# Patient Record
Sex: Male | Born: 1980 | Race: White | Hispanic: No | Marital: Single
Health system: Southern US, Academic
[De-identification: ages and names within clinical notes are randomized; demographics above are authoritative.]

## PROBLEM LIST (undated history)

## (undated) ENCOUNTER — Encounter

## (undated) ENCOUNTER — Ambulatory Visit

## (undated) ENCOUNTER — Telehealth

## (undated) ENCOUNTER — Inpatient Hospital Stay

## (undated) ENCOUNTER — Encounter
Attending: Student in an Organized Health Care Education/Training Program | Primary: Student in an Organized Health Care Education/Training Program

## (undated) ENCOUNTER — Ambulatory Visit: Payer: Medicaid (Managed Care)

## (undated) ENCOUNTER — Encounter: Attending: Physician Assistant | Primary: Physician Assistant

## (undated) ENCOUNTER — Ambulatory Visit: Payer: PRIVATE HEALTH INSURANCE

## (undated) ENCOUNTER — Encounter: Attending: Gastroenterology | Primary: Gastroenterology

## (undated) ENCOUNTER — Encounter: Attending: Surgery | Primary: Surgery

## (undated) ENCOUNTER — Ambulatory Visit
Payer: PRIVATE HEALTH INSURANCE | Attending: Student in an Organized Health Care Education/Training Program | Primary: Student in an Organized Health Care Education/Training Program

## (undated) ENCOUNTER — Ambulatory Visit: Attending: Surgery | Primary: Surgery

## (undated) DIAGNOSIS — I10 Essential (primary) hypertension: Secondary | ICD-10-CM

## (undated) DIAGNOSIS — Z87442 Personal history of urinary calculi: Secondary | ICD-10-CM

## (undated) DIAGNOSIS — K859 Acute pancreatitis without necrosis or infection, unspecified: Secondary | ICD-10-CM

## (undated) HISTORY — DX: Essential (primary) hypertension: I10

## (undated) HISTORY — PX: CHOLECYSTECTOMY: SHX55

## (undated) HISTORY — PX: ESOPHAGOGASTRODUODENOSCOPY: SHX1529

## (undated) HISTORY — PX: LITHOTRIPSY: SUR834

## (undated) HISTORY — PX: NERVE SURGERY: SHX1016

## (undated) MED ORDER — PANTOPRAZOLE 40 MG TABLET,DELAYED RELEASE: Freq: Every day | ORAL | 0.00000 days

## (undated) MED ORDER — SUCRALFATE 1 GRAM TABLET: Freq: Four times a day (QID) | ORAL | 0 days

---
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
## 2005-02-10 ENCOUNTER — Emergency Department (HOSPITAL_COMMUNITY): Admission: AD | Admit: 2005-02-10 | Discharge: 2005-02-10 | Payer: Self-pay | Admitting: Emergency Medicine

## 2007-10-17 ENCOUNTER — Ambulatory Visit (HOSPITAL_COMMUNITY): Admission: RE | Admit: 2007-10-17 | Discharge: 2007-10-17 | Payer: Self-pay | Admitting: Urology

## 2008-03-08 ENCOUNTER — Emergency Department (HOSPITAL_COMMUNITY): Admission: EM | Admit: 2008-03-08 | Discharge: 2008-03-08 | Payer: Self-pay | Admitting: Family Medicine

## 2010-04-20 LAB — POCT RAPID STREP A (OFFICE): Streptococcus, Group A Screen (Direct): NEGATIVE

## 2012-08-29 ENCOUNTER — Ambulatory Visit (INDEPENDENT_AMBULATORY_CARE_PROVIDER_SITE_OTHER): Payer: BC Managed Care – PPO | Admitting: Family Medicine

## 2012-08-29 ENCOUNTER — Ambulatory Visit (INDEPENDENT_AMBULATORY_CARE_PROVIDER_SITE_OTHER): Payer: BC Managed Care – PPO

## 2012-08-29 ENCOUNTER — Encounter: Payer: Self-pay | Admitting: Family Medicine

## 2012-08-29 VITALS — BP 116/76 | HR 75 | Temp 98.0°F | Ht 73.0 in | Wt 195.0 lb

## 2012-08-29 DIAGNOSIS — S6980XA Other specified injuries of unspecified wrist, hand and finger(s), initial encounter: Secondary | ICD-10-CM

## 2012-08-29 DIAGNOSIS — S6991XA Unspecified injury of right wrist, hand and finger(s), initial encounter: Secondary | ICD-10-CM

## 2012-08-29 DIAGNOSIS — I10 Essential (primary) hypertension: Secondary | ICD-10-CM

## 2012-08-29 NOTE — Patient Instructions (Signed)
Fracture A fracture is a break in a bone, due to a force on the bone that is greater than the bone's strength can handle. There are many types of fractures, including:  Complete fracture: The break passes completely through the bone.  Displaced: The ends of the bone fragments are not properly aligned.  Non-displaced: The ends of the bone fragments are in proper alignment.  Incomplete fracture (greenstick): The break does not pass completely through the bone. Incomplete fractures may or may not be angular (angulated).  Open fracture (compound): Part of the broken bone pokes through the skin. Open fractures have a high risk for infection.  Closed fracture: The fracture has not broken through the skin.  Comminuted fracture: The bone is broken into more than two pieces.  Compression fracture: The break occurs from extreme pressure on the bone (includes crushing injury).  Impacted fracture: The broken bone ends have been driven into each other.  Avulsion fracture: A ligament or tendon pulls a small piece of bone off from the main bony segment.  Pathologic fracture: A fracture due to the bone being made weak by a disease (osteoporosis or tumors).  Stress fracture: A fracture caused by intense exercise or repetitive and prolonged pressure that makes the bone weak. SYMPTOMS   Pain, tenderness, bleeding, bruising, and swelling at the fracture site.  Weakness and inability to bear weight on the injured extremity.  Paleness and deformity (sometimes).  Loss of pulse, numbness, tingling, or paralysis below the fracture site (usually a limb); these are emergencies. CAUSES  Bone being subjected to a force greater than its strength. RISK INCREASES WITH:  Contact sports and falls from heights.  Previous or current bone problems (osteoporosis or tumors).  Poor balance.  Poor strength and flexibility. PREVENTION   Warm up and stretch properly before activity.  Maintain physical  fitness:  Cardiovascular fitness.  Muscle strength.  Flexibility and endurance.  Wear proper protective equipment.  Use proper exercise technique. RELATED COMPLICATIONS   Bone fails to heal (nonunion).  Bone heals in a poor position (malunion).  Low blood volume (hypovolemic), shock due to blood loss.  Clump of fat cells travels through the blood (fat embolus) from the injury site to the lungs or brain (more common with thigh fractures).  Obstruction of nearby arteries. TREATMENT  Treatment first requires realigning of the bones (reduction) by a medically trained person, if the fracture is displaced. After realignment if the fracture is completed, or for non-displaced fractures, ice and medicine are used to reduce pain and inflammation. The bone and adjacent joints are then restrained with a splint, cast, or brace to allow the bones to heal without moving. Surgery is sometimes needed, to reposition the bones and hold the position with rods, pins, plates, or screws. Restraint for long periods of time may result in muscle and joint weakness or build up of fluid in tissues (edema). For this reason, physical therapy is often needed to regain strength and full range of motion. Recovery is complete when there is no bone motion at the fracture site and x-rays (radiographs) show complete healing.  MEDICATION   General anesthesia, sedation, or muscle relaxants may be needed to allow for realignment of the fracture. If pain medicine is needed, nonsteroidal anti-inflammatory medicines (aspirin and ibuprofen), or other minor pain relievers (acetaminophen), are often advised.  Do not take pain medicine for 7 days before surgery.  Stronger pain relievers may be prescribed by your caregiver. Use only as directed and only as much   as you need. SEEK MEDICAL CARE IF:   The following occur after restraint or surgery. (Report any of these signs immediately):  Swelling above or below the fracture  site.  Severe, persistent pain.  Blue or gray skin below the fracture site, especially under the nails. Numbness or loss of feeling below the fracture site. Document Released: 12/25/2004 Document Revised: 12/12/2011 Document Reviewed: 04/08/2008 [employer] Patient Information 2014 [employer].  

## 2012-08-29 NOTE — Progress Notes (Signed)
  Subjective:    Patient ID: Darren Adams, male    DOB: 1980-02-17, 32 y.o.   MRN: [id number]  HPI This 32 y.o. male presents for evaluation of establishment visit and to get his right 4th finger Checked.  He injured his finger last night at work when he picked up a package and the Finger felt like it was pulled and started to hurt..   Review of Systems No chest pain, SOB, HA, dizziness, vision change, N/V, diarrhea, constipation, dysuria, urinary urgency or frequency, myalgias, arthralgias or rash.     Objective:   Physical Exam  Vital signs noted  Well developed well nourished male.  HEENT - Head atraumatic Normocephalic                Eyes - PERRLA, Conjuctiva - clear Sclera- Clear EOMI                Ears - EAC's Wnl TM's Wnl Gross Hearing WNL                Nose - Nares patent                 Throat - oropharanx wnl Respiratory - Lungs CTA bilateral Cardiac - RRR S1 and S2 without murmur MS - Swelling and tenderness right fourth DIP joint  Preliminary read of right 4th ring finger xray - fracture through DIP joint    Assessment & Plan:  Finger injury, right, initial encounter - Plan: DG Finger Ring Right  Essential hypertension, benign - Controlled and continue lisinopril.

## 2012-11-13 ENCOUNTER — Other Ambulatory Visit: Payer: Self-pay

## 2014-03-20 ENCOUNTER — Encounter (HOSPITAL_COMMUNITY): Payer: Self-pay

## 2014-03-20 ENCOUNTER — Emergency Department (HOSPITAL_COMMUNITY): Payer: Worker's Compensation

## 2014-03-20 ENCOUNTER — Emergency Department (HOSPITAL_COMMUNITY)
Admission: EM | Admit: 2014-03-20 | Discharge: 2014-03-20 | Disposition: A | Discharge status: Home / Self Care | Payer: Worker's Compensation | Attending: Emergency Medicine | Admitting: Emergency Medicine

## 2014-03-20 DIAGNOSIS — Z880 Allergy status to penicillin: Secondary | ICD-10-CM | POA: Diagnosis not present

## 2014-03-20 DIAGNOSIS — Y9389 Activity, other specified: Secondary | ICD-10-CM | POA: Insufficient documentation

## 2014-03-20 DIAGNOSIS — Y990 Civilian activity done for income or pay: Secondary | ICD-10-CM | POA: Diagnosis not present

## 2014-03-20 DIAGNOSIS — Z720 Tobacco use: Secondary | ICD-10-CM | POA: Insufficient documentation

## 2014-03-20 DIAGNOSIS — Y9289 Other specified places as the place of occurrence of the external cause: Secondary | ICD-10-CM | POA: Diagnosis not present

## 2014-03-20 DIAGNOSIS — S4991XA Unspecified injury of right shoulder and upper arm, initial encounter: Secondary | ICD-10-CM | POA: Insufficient documentation

## 2014-03-20 DIAGNOSIS — Z79899 Other long term (current) drug therapy: Secondary | ICD-10-CM | POA: Diagnosis not present

## 2014-03-20 DIAGNOSIS — I10 Essential (primary) hypertension: Secondary | ICD-10-CM | POA: Diagnosis not present

## 2014-03-20 DIAGNOSIS — W228XXA Striking against or struck by other objects, initial encounter: Secondary | ICD-10-CM | POA: Insufficient documentation

## 2014-03-20 DIAGNOSIS — S0101XA Laceration without foreign body of scalp, initial encounter: Secondary | ICD-10-CM | POA: Diagnosis not present

## 2014-03-20 DIAGNOSIS — S0990XA Unspecified injury of head, initial encounter: Secondary | ICD-10-CM | POA: Diagnosis present

## 2014-03-20 LAB — I-STAT CHEM 8, ED
BUN: 11 mg/dL (ref 6–23)
CALCIUM ION: 1.13 mmol/L (ref 1.12–1.23)
CHLORIDE: 108 mmol/L (ref 96–112)
CREATININE: 0.9 mg/dL (ref 0.50–1.35)
Glucose, Bld: 103 mg/dL — ABNORMAL HIGH (ref 70–99)
HCT: 43 % (ref 39.0–52.0)
Hemoglobin: 14.6 g/dL (ref 13.0–17.0)
Potassium: 3.6 mmol/L (ref 3.5–5.1)
SODIUM: 144 mmol/L (ref 135–145)
TCO2: 17 mmol/L (ref 0–100)

## 2014-03-20 MED ORDER — LIDOCAINE-EPINEPHRINE (PF) 2 %-1:200000 IJ SOLN
10.0000 mL | Freq: Once | INTRAMUSCULAR | Status: AC
  Administered 2014-03-20: 10 mL via INTRADERMAL
  Filled 2014-03-20: qty 20

## 2014-03-20 MED ORDER — MORPHINE SULFATE 4 MG/ML IJ SOLN
4.0000 mg | Freq: Once | INTRAMUSCULAR | Status: AC
  Administered 2014-03-20: 4 mg via INTRAVENOUS

## 2014-03-20 MED ORDER — HYDROCODONE-ACETAMINOPHEN 5-325 MG PO TABS: 2.0000 | ORAL_TABLET | ORAL | Status: DC | PRN

## 2014-03-20 MED ORDER — LIDOCAINE-EPINEPHRINE 1 %-1:100000 IJ SOLN
20.0000 mL | Freq: Once | INTRAMUSCULAR | Status: AC
  Administered 2014-03-20: 20 mL via INTRADERMAL
  Filled 2014-03-20: qty 1

## 2014-03-20 MED ORDER — MORPHINE SULFATE 4 MG/ML IJ SOLN
4.0000 mg | Freq: Once | INTRAMUSCULAR | Status: DC
  Filled 2014-03-20: qty 1

## 2014-03-20 MED ORDER — HYDROCODONE-ACETAMINOPHEN 5-325 MG PO TABS
2.0000 | ORAL_TABLET | Freq: Once | ORAL | Status: AC
  Administered 2014-03-20: 2 via ORAL
  Filled 2014-03-20: qty 2

## 2014-03-20 MED ORDER — MORPHINE SULFATE 4 MG/ML IJ SOLN
4.0000 mg | Freq: Once | INTRAMUSCULAR | Status: AC
  Administered 2014-03-20: 4 mg via INTRAVENOUS
  Filled 2014-03-20: qty 1

## 2014-03-20 MED ORDER — TETANUS-DIPHTH-ACELL PERTUSSIS 5-2.5-18.5 LF-MCG/0.5 IM SUSP
0.5000 mL | Freq: Once | INTRAMUSCULAR | Status: AC
  Administered 2014-03-20: 0.5 mL via INTRAMUSCULAR
  Filled 2014-03-20: qty 0.5

## 2014-03-20 MED ORDER — HYDROMORPHONE HCL 1 MG/ML IJ SOLN
2.0000 mg | Freq: Once | INTRAMUSCULAR | Status: AC
  Administered 2014-03-20: 2 mg via INTRAVENOUS
  Filled 2014-03-20: qty 2

## 2014-03-20 MED ORDER — HYDROCODONE-ACETAMINOPHEN 5-325 MG PO TABS: 2.0000 | ORAL_TABLET | Freq: Once | ORAL | Status: DC

## 2014-03-20 NOTE — ED Provider Notes (Signed)
CSN: [id number]     Arrival date & time 03/20/14  [id number] History  This chart was scribed for Loren Raceravid Jazzma Neidhardt, MD by Bronson CurbJacqueline Melvin, ED Scribe. This patient was seen in room [facility] and the patient's care was started at 3:37 AM.   Chief Complaint  Patient presents with  . Head Injury  . Shoulder Injury    The history is provided by the patient. No language interpreter was used.     HPI Comments: Darren Adams is a 34 y.o. male, brought in by ambulance, who presents to the Emergency Department complaining of right shoulder and head injury that occurred PTA. Patient states he was at work when a 3-5 pound spool came off a machine and struck the right side of his head and right shoulder. Patient suspects he blacked out for an unspecified period of time. There is associated ~4cm laceration to the right side of head with controlled bleed, headache, right jaw pain/swelling, right shoulder pain, and a tingling sensation to all fingers of the right hand. Patient rates his pain a 8/10. He denies any other injuries. Last tetanus unknown.   Past Medical History  Diagnosis Date  . Hypertension    Past Surgical History  Procedure Laterality Date  . Cholecystectomy    . Nerve surgery      Left elbow   Family History  Problem Relation Age of Onset  . Hypertension Mother    History  Substance Use Topics  . Smoking status: Current Every Day Smoker  . Smokeless tobacco: Not on file  . Alcohol Use: Yes    Review of Systems  Constitutional: Negative for fever and chills.  Eyes: Negative for visual disturbance.  Respiratory: Negative for shortness of breath.   Cardiovascular: Negative for chest pain.  Gastrointestinal: Negative for nausea, vomiting and abdominal pain.  Musculoskeletal: Positive for myalgias and arthralgias (right shoulder). Negative for neck pain.  Skin: Positive for wound.  Neurological: Positive for syncope, numbness and headaches. Negative for weakness.  All other  systems reviewed and are negative.     Allergies  Penicillins  Home Medications   Prior to Admission medications   Medication Sig Start Date End Date Taking? Authorizing Provider  lisinopril (PRINIVIL,ZESTRIL) 20 MG tablet Take 20 mg by mouth daily.   Yes Historical Provider, MD  HYDROcodone-acetaminophen (NORCO/VICODIN) 5-325 MG per tablet Take 2 tablets by mouth every 4 (four) hours as needed. 03/20/14   Eber HongBrian Miller, MD  Vitamin D, Ergocalciferol, (DRISDOL) 50000 UNITS CAPS capsule Take 50,000 Units by mouth.    Historical Provider, MD   Triage Vitals: BP 157/97 mmHg  Pulse 100  Temp(Src) 99.4 F (37.4 C) (Oral)  Resp 20  Ht 6\' 1"  (1.854 m)  Wt 230 lb (104.327 kg)  BMI 30.35 kg/m2  SpO2 100%  Physical Exam  Constitutional: He is oriented to person, place, and time. He appears well-developed and well-nourished. No distress.  HENT:  Head: Normocephalic.  Mouth/Throat: Oropharynx is clear and moist.  4 cm horizontal laceration just superior to the right ear on the parietal scalp. No active bleeding  Eyes: Conjunctivae and EOM are normal. Pupils are equal, round, and reactive to light.  Neck: Normal range of motion. Neck supple. No tracheal deviation present.  No midline cervical tenderness to palpation.  Cardiovascular: Normal rate and regular rhythm.  Exam reveals no gallop and no friction rub.   No murmur heard. Pulmonary/Chest: Effort normal and breath sounds normal. No respiratory distress. He has no wheezes. He has  no rales. He exhibits no tenderness.  Abdominal: Soft. Bowel sounds are normal. He exhibits no distension and no mass. There is no tenderness. There is no rebound and no guarding.  Musculoskeletal: Normal range of motion. He exhibits tenderness. He exhibits no edema.  Patient with erythema and swelling to the right trapezius and deltoid muscles. Tenderness to palpation diffusely over the right shoulder. No tenderness over the right clavicle. Decreased range of  motion due to pain. Distal pulses intact.  Neurological: He is alert and oriented to person, place, and time.  "tingling" sensation to the tips of the fingers of the right hand. Equal bilateral grip strength.  Skin: Skin is warm and dry. No rash noted. No erythema.  Psychiatric: He has a normal mood and affect. His behavior is normal.  Nursing note and vitals reviewed.   ED Course  LACERATION REPAIR Date/Time: 03/20/2014 6:57 AM Performed by: Loren Racer Authorized by: Ranae Palms, Maceo Hernan Consent: The procedure was performed in an emergent situation. Patient identity confirmed: verbally with patient Body area: head/neck Location details: scalp Laceration length: 4 cm Foreign bodies: no foreign bodies Tendon involvement: none Vascular damage: yes Local anesthetic: lidocaine 2% without epinephrine Anesthetic total: 4 ml Skin closure: staples Subcutaneous closure: 5-0 Vicryl Number of sutures: 2 Technique: simple Approximation: close Approximation difficulty: simple Comments: Unable to visualize source of bleeding. Skin staples placed    (including critical care time)  DIAGNOSTIC STUDIES: Oxygen Saturation is 100% on room air, normal by my interpretation.    COORDINATION OF CARE: At 33 Discussed treatment plan with patient. Patient agrees.     Labs Review Labs Reviewed  I-STAT CHEM 8, ED - Abnormal; Notable for the following:    Glucose, Bld 103 (*)    All other components within normal limits    Imaging Review Dg Shoulder Right  03/20/2014   CLINICAL DATA:  Hit with spool that fell off yarn machine at work tonight, RIGHT shoulder pain.  EXAM: RIGHT SHOULDER - 2+ VIEW  COMPARISON:  None.  FINDINGS: There is no evidence of fracture or dislocation. There is no evidence of arthropathy or other focal bone abnormality. Soft tissues are unremarkable.  IMPRESSION: Negative.   Electronically Signed   By: Awilda Metro   On: 03/20/2014 05:23   Ct Head Wo  Contrast  03/20/2014   CLINICAL DATA:  Hit in face with large Spool , possible loss of consciousness. RIGHT face laceration. RIGHT jaw pain and swelling.  EXAM: CT HEAD WITHOUT CONTRAST  CT MAXILLOFACIAL WITHOUT CONTRAST  TECHNIQUE: Multidetector CT imaging of the head and maxillofacial structures were performed using the standard protocol without intravenous contrast. Multiplanar CT image reconstructions of the maxillofacial structures were also generated.  COMPARISON:  None.  FINDINGS: CT HEAD FINDINGS  The ventricles and sulci are normal. No intraparenchymal hemorrhage, mass effect nor midline shift. No acute large vascular territory infarcts.  No abnormal extra-axial fluid collections. Basal cisterns are patent. No skull fracture. Moderate to large RIGHT frontotemporal scalp hematoma with subcutaneous gas.  CT MAXILLOFACIAL FINDINGS  Mandible is intact, the condyles are located. No acute facial fracture.  Mild paranasal sinus mucosal thickening without air-fluid levels. Nasal septum is deviated to the LEFT. No destructive bony lesions.  Ocular globes and orbital contents are unremarkable. Visualized mastoid air cells are well aerated.  IMPRESSION: CT HEAD: Moderate to large RIGHT frontoparietal scalp hematoma, laceration without skull fracture.  No acute intracranial process, normal noncontrast CT of the head.  CT MAXILLOFACIAL:  No acute  facial fracture.   Electronically Signed   By: Awilda Metro   On: 03/20/2014 05:21   Ct Maxillofacial Wo Cm  03/20/2014   CLINICAL DATA:  Hit in face with large Spool , possible loss of consciousness. RIGHT face laceration. RIGHT jaw pain and swelling.  EXAM: CT HEAD WITHOUT CONTRAST  CT MAXILLOFACIAL WITHOUT CONTRAST  TECHNIQUE: Multidetector CT imaging of the head and maxillofacial structures were performed using the standard protocol without intravenous contrast. Multiplanar CT image reconstructions of the maxillofacial structures were also generated.  COMPARISON:   None.  FINDINGS: CT HEAD FINDINGS  The ventricles and sulci are normal. No intraparenchymal hemorrhage, mass effect nor midline shift. No acute large vascular territory infarcts.  No abnormal extra-axial fluid collections. Basal cisterns are patent. No skull fracture. Moderate to large RIGHT frontotemporal scalp hematoma with subcutaneous gas.  CT MAXILLOFACIAL FINDINGS  Mandible is intact, the condyles are located. No acute facial fracture.  Mild paranasal sinus mucosal thickening without air-fluid levels. Nasal septum is deviated to the LEFT. No destructive bony lesions.  Ocular globes and orbital contents are unremarkable. Visualized mastoid air cells are well aerated.  IMPRESSION: CT HEAD: Moderate to large RIGHT frontoparietal scalp hematoma, laceration without skull fracture.  No acute intracranial process, normal noncontrast CT of the head.  CT MAXILLOFACIAL:  No acute facial fracture.   Electronically Signed   By: Awilda Metro   On: 03/20/2014 05:21     EKG Interpretation None      MDM   Final diagnoses:  Laceration of scalp with complication, initial encounter    I personally performed the services described in this documentation, which was scribed in my presence. The recorded information has been reviewed and is accurate.  Patient's wound started actively bleeding on return from CT scan. Pulsatile bleeding. Injected 2% lidocaine with epinephrine. Attempted to explore wound. Unable to visualize source of bleeding. Blind figure-of-eight Vicryl sutures placed without effective control bleeding. Wound was temporized with skin staples. Discussed with Dr. Donell Beers. Trauma surgery will see in the emergency department.  Loren Racer, MD 03/21/14 [phone]

## 2014-03-20 NOTE — ED Notes (Signed)
Dilaudid 2 mg given IV

## 2014-03-20 NOTE — ED Notes (Signed)
Pt reports he was at work when a spindle came off of a machine and struck the R side of his head and R shoulder.  Shoulder has been immobilized in the field.  Head dressed in the field.  EMS reports a 2 inch laceration to head.  Bleeding controlled.  Pt unsure of LOC, states he doesn't remember.  Pt states he does remember getting hit.  Pt c/o R shoulder pain and head pain.  Pt alert and oriented x 4.

## 2014-03-20 NOTE — ED Notes (Signed)
Pt with bleeding from head lac.  Saturated through 4 x 4's and Kerlix gauze.  Pressure being applied by Onalee Huaavid, EMT.  Will redress wound.

## 2014-03-20 NOTE — H&P (Signed)
Darren Adams is an 34 y.o. male.   Chief Complaint: a spool hit me in the head HPI: 34 yo WM who was at work earlier when he got struck on the right side of his head and shoulder with a spool that fell off a machine at work. He reports that he apparently blacked out. [name]. [name] and evaluated by ER. Called bc of ongoing bleeding from scalp lac after staple repair. C/o headache, right sided head pain, right cheek/face, shoulder pain. ED scanned head, neck, cspine. Tetanus given.   Smokes about 1/2 ppd. Drinks some etoh but none tonight.   Past Medical History  Diagnosis Date  . Hypertension     Past Surgical History  Procedure Laterality Date  . Cholecystectomy    . Nerve surgery      Left elbow    Family History  Problem Relation Age of Onset  . Hypertension Mother    Social History:  reports that he has been smoking.  He does not have any smokeless tobacco history on file. He reports that he drinks alcohol. He reports that he does not use illicit drugs.  Allergies:  Allergies  Allergen Reactions  . Penicillins Rash     (Not in a hospital admission)  No results found for this or any previous visit (from the past 48 hour(s)). Dg Shoulder Right  03/20/2014   CLINICAL DATA:  Hit with spool that fell off yarn machine at work tonight, RIGHT shoulder pain.  EXAM: RIGHT SHOULDER - 2+ VIEW  COMPARISON:  None.  FINDINGS: There is no evidence of fracture or dislocation. There is no evidence of arthropathy or other focal bone abnormality. Soft tissues are unremarkable.  IMPRESSION: Negative.   Electronically Signed   By: Awilda Metroourtnay  Bloomer   On: 03/20/2014 05:23   Ct Head Wo Contrast  03/20/2014   CLINICAL DATA:  Hit in face with large Spool , possible loss of consciousness. RIGHT face laceration. RIGHT jaw pain and swelling.  EXAM: CT HEAD WITHOUT CONTRAST  CT MAXILLOFACIAL WITHOUT CONTRAST  TECHNIQUE: Multidetector CT imaging of the head and maxillofacial  structures were performed using the standard protocol without intravenous contrast. Multiplanar CT image reconstructions of the maxillofacial structures were also generated.  COMPARISON:  None.  FINDINGS: CT HEAD FINDINGS  The ventricles and sulci are normal. No intraparenchymal hemorrhage, mass effect nor midline shift. No acute large vascular territory infarcts.  No abnormal extra-axial fluid collections. Basal cisterns are patent. No skull fracture. Moderate to large RIGHT frontotemporal scalp hematoma with subcutaneous gas.  CT MAXILLOFACIAL FINDINGS  Mandible is intact, the condyles are located. No acute facial fracture.  Mild paranasal sinus mucosal thickening without air-fluid levels. Nasal septum is deviated to the LEFT. No destructive bony lesions.  Ocular globes and orbital contents are unremarkable. Visualized mastoid air cells are well aerated.  IMPRESSION: CT HEAD: Moderate to large RIGHT frontoparietal scalp hematoma, laceration without skull fracture.  No acute intracranial process, normal noncontrast CT of the head.  CT MAXILLOFACIAL:  No acute facial fracture.   Electronically Signed   By: Awilda Metroourtnay  Bloomer   On: 03/20/2014 05:21   Ct Maxillofacial Wo Cm  03/20/2014   CLINICAL DATA:  Hit in face with large Spool , possible loss of consciousness. RIGHT face laceration. RIGHT jaw pain and swelling.  EXAM: CT HEAD WITHOUT CONTRAST  CT MAXILLOFACIAL WITHOUT CONTRAST  TECHNIQUE: Multidetector CT imaging of the head and maxillofacial structures were performed using the  standard protocol without intravenous contrast. Multiplanar CT image reconstructions of the maxillofacial structures were also generated.  COMPARISON:  None.  FINDINGS: CT HEAD FINDINGS  The ventricles and sulci are normal. No intraparenchymal hemorrhage, mass effect nor midline shift. No acute large vascular territory infarcts.  No abnormal extra-axial fluid collections. Basal cisterns are patent. No skull fracture. Moderate to large  RIGHT frontotemporal scalp hematoma with subcutaneous gas.  CT MAXILLOFACIAL FINDINGS  Mandible is intact, the condyles are located. No acute facial fracture.  Mild paranasal sinus mucosal thickening without air-fluid levels. Nasal septum is deviated to the LEFT. No destructive bony lesions.  Ocular globes and orbital contents are unremarkable. Visualized mastoid air cells are well aerated.  IMPRESSION: CT HEAD: Moderate to large RIGHT frontoparietal scalp hematoma, laceration without skull fracture.  No acute intracranial process, normal noncontrast CT of the head.  CT MAXILLOFACIAL:  No acute facial fracture.   Electronically Signed   By: Awilda Metro   On: 03/20/2014 05:21    Review of Systems  Constitutional: Negative for weight loss.  HENT: Negative for ear discharge, ear pain, hearing loss and tinnitus.        Right cheek, side of face pain  Eyes: Negative for blurred vision, double vision, photophobia and pain.  Respiratory: Negative for cough, sputum production and shortness of breath.   Cardiovascular: Negative for chest pain.  Gastrointestinal: Positive for nausea. Negative for vomiting and abdominal pain.  Genitourinary: Negative for dysuria, urgency, frequency and flank pain.  Musculoskeletal: Negative for myalgias, back pain, joint pain, falls and neck pain.       Right shoulder pain  Neurological: Positive for loss of consciousness and headaches. Negative for dizziness, tingling, sensory change and focal weakness.  Endo/Heme/Allergies: Does not bruise/bleed easily.  Psychiatric/Behavioral: Negative for depression, memory loss and substance abuse. The patient is not nervous/anxious.     Blood pressure 159/91, pulse 88, temperature 99.4 F (37.4 C), temperature source Oral, resp. rate 20, height  (1.854 m), weight 230 lb (104.327 kg), SpO2 100 %. Physical Exam  Vitals reviewed. Constitutional: He is oriented to person, place, and time. He appears well-developed and  well-nourished. He is cooperative. No distress. Cervical collar and nasal cannula in place.  HENT:  Head: Normocephalic. Head is with laceration. Head is without raccoon's eyes, without Battle's sign, without abrasion, without contusion, without right periorbital erythema and without left periorbital erythema.    Right Ear: Hearing, tympanic membrane, external ear and ear canal normal. No lacerations. No drainage or tenderness. No foreign bodies. Tympanic membrane is not perforated. No hemotympanum.  Left Ear: Hearing, tympanic membrane, external ear and ear canal normal. No lacerations. No drainage or tenderness. No foreign bodies. Tympanic membrane is not perforated. No hemotympanum.  Nose: Nose normal. No nose lacerations, sinus tenderness, nasal deformity or nasal septal hematoma. No epistaxis.  Mouth/Throat: Uvula is midline, oropharynx is clear and moist and mucous membranes are normal. No lacerations.  4cm Right scalp lac s/p staples but with hematoma about size of walnut with oozing thru staple line. TTP in area.   Eyes: Conjunctivae, EOM and lids are normal. Pupils are equal, round, and reactive to light. No scleral icterus.  Neck: Trachea normal and normal range of motion. No JVD present. No spinous process tenderness and no muscular tenderness present. Carotid bruit is not present. No tracheal deviation present. No thyromegaly present.  Cardiovascular: Normal rate, regular rhythm, normal heart sounds, intact distal pulses and normal pulses.   Respiratory: Effort normal  and breath sounds normal. No respiratory distress. He exhibits no tenderness, no bony tenderness, no laceration and no crepitus.  GI: Soft. Normal appearance. He exhibits no distension. Bowel sounds are decreased. There is no tenderness. There is no rigidity, no rebound, no guarding and no CVA tenderness.  Musculoskeletal: Normal range of motion. He exhibits no edema.       Right shoulder: He exhibits tenderness.        Arms: Large right shoulder skin contusion/light abrasion/TTP; MAE; no obvious fracture  Lymphadenopathy:    He has no cervical adenopathy.  Neurological: He is alert and oriented to person, place, and time. He has normal strength. No cranial nerve deficit or sensory deficit. GCS eye subscore is 4. GCS verbal subscore is 5. GCS motor subscore is 6.  Skin: Skin is warm and dry. Abrasion and laceration noted. He is not diaphoretic.  Psychiatric: He has a normal mood and affect. His speech is normal and behavior is normal.     Assessment/Plan Blunt head trauma Right scalp laceration with hematoma Closed head injury Right shoulder pain/abrasion  I observed the scalp lac which was stapled by the ED over the course of 1.5hrs. There is no significant bleeding. The hematoma is not expanding. There was a tiny ooze from the most posterior skin staple. I do not feel it is necessary to reopen the wound. The hematoma should compress/tamponade any bleeding.  I filled out work paperwork that was brought with the pt from his job. I recommended that he stay out of work for 1 week with return on 3/21  He will need his skin staples removed in 12-14 days I think he is ok for dc but will need to be supervised today He will need to be instructed to return for worsening bleeding, persistent emesis, mental status changes.  He can expect to have some headaches and can take motrin and tylenol.   Mary Sella. Andrey Campanile, MD, FACS General, Bariatric, & Minimally Invasive Surgery [facility], [place]   [facility] M 03/20/2014, 8:44 AM

## 2014-03-20 NOTE — ED Notes (Signed)
Phlebotomy notified of need for Employer requested drug screen.

## 2014-03-20 NOTE — ED Provider Notes (Signed)
Discussed with general surgery who has seen the patient, Dr. Andrey CampanileWilson recommends discharge home, can follow up outpatient, patient has been instructed to return should his bleeding worsen.  Eber HongBrian Daila Elbert, MD 03/20/14 [phone]

## 2014-03-20 NOTE — ED Notes (Signed)
Placed one white metal necklace and 2 white metal earrings in specimen cup with pt label.  Items at the bedside.

## 2014-03-20 NOTE — Discharge Instructions (Signed)
Laceration: ° °The laceration is a cut or lesion that goes through all layers of the skin and into the tissue just beneath the skin. This may have been repaired by your caregiver with either stitches or a tissue adhesive similar to a super glue.  Please keep your wound clean and dry with a topical antibiotic and a sterile dressing for the next 48 hours. Your wound should be reevaluated by your family doctor within the next 2 days for a recheck. If you do not have a family doctor you may return to the emergency department for a recheck or see the list of followup doctors below. ° °Seek medical attention if: ° °· There is redness, swelling, increasing pain in the wound °· There is a red line that goes up your arm or leg °· Pus is coming from the wound °· He developed an unexplained temperature above 100.4°F °· He noticed a foul-smelling coming from the wound or dressing °· There is a breaking open of the wound after the sutures have been removed °· If you did not receive a tetanus shot today because she thought she were up to date but did not recall when her last one was given, nature to check with her primary caregiver to determine if she needs one. ° ° °RESOURCE GUIDE ° °Dental Problems ° °Patients with Medicaid: °[facility]                     [facility] °[address]                                           [address] °Phone:  [phone]                                                  Phone:  510-2600 ° °If unable to pay or uninsured, contact:  [facility] or [facility] to become qualified for the adult dental clinic. ° °Chronic Pain Problems °Contact [facility]  [phone] °Patients need to be referred by their primary care doctor. ° °Insufficient Money for Medicine °Contact [employer]:  call "211" or [facility] 271-5999. ° °No Primary Care Doctor °Call [facility]  832-8000 °Other agencies that provide inexpensive medical  care °   [facility]  [phone] °   [facility]  [phone] °   [facility]  [phone] °   [facility]  832-4777 °   [facility]  [phone] °   [facility]  [phone] ° °Psychological Services °[facility]  [phone] °[facility]  [phone] °[facility]   [phone] (emergency services [phone]) ° °Substance Abuse Resources °[facility]  336-882-2125 °[facility] 336-784-9470 °[facility] 336-285-9073 °[facility] 336-845-3988 °Residential & Outpatient Substance Abuse Program  800-659-3381 ° °Abuse/Neglect °[place] [facility] (336) 641-3795 °[place] [facility] Abuse Hotline 800-378-5315 (After Hours) ° °Emergency Shelter °[facility] (336) 271-5985 ° °Maternity Homes °[facility] (336) 275-9566 °[facility] (704) 372-4663 ° °MRSA Hotline #:   832-7006 ° ° ° °[place] [facility] ° °[facility]   [place]     [employer]                          [facility] °[address] [place]                       [address]      [address]  °[place]                                                [place]                            [place] °Phone:  349-3220                                   Phone:  [phone]                 Phone:  342-8140 ° °[facility] °Phone:  342-8316 ° °[place] Child Abuse Hotline °(336) 342-1394 °(336) 342-3537 (After Hours) ° ° ° °

## 2014-03-20 NOTE — ED Notes (Signed)
Dr Wilson at bedside.

## 2014-03-24 ENCOUNTER — Emergency Department (HOSPITAL_COMMUNITY)
Admission: EM | Admit: 2014-03-24 | Discharge: 2014-03-24 | Disposition: A | Discharge status: Home / Self Care | Payer: Worker's Compensation | Attending: Emergency Medicine | Admitting: Emergency Medicine

## 2014-03-24 ENCOUNTER — Encounter (HOSPITAL_COMMUNITY): Payer: Self-pay | Admitting: *Deleted

## 2014-03-24 DIAGNOSIS — Z79899 Other long term (current) drug therapy: Secondary | ICD-10-CM | POA: Insufficient documentation

## 2014-03-24 DIAGNOSIS — Z720 Tobacco use: Secondary | ICD-10-CM | POA: Diagnosis not present

## 2014-03-24 DIAGNOSIS — R110 Nausea: Secondary | ICD-10-CM | POA: Insufficient documentation

## 2014-03-24 DIAGNOSIS — R519 Headache, unspecified: Secondary | ICD-10-CM

## 2014-03-24 DIAGNOSIS — I10 Essential (primary) hypertension: Secondary | ICD-10-CM | POA: Diagnosis not present

## 2014-03-24 DIAGNOSIS — R51 Headache: Secondary | ICD-10-CM | POA: Diagnosis not present

## 2014-03-24 DIAGNOSIS — R42 Dizziness and giddiness: Secondary | ICD-10-CM | POA: Diagnosis not present

## 2014-03-24 DIAGNOSIS — Z880 Allergy status to penicillin: Secondary | ICD-10-CM | POA: Diagnosis not present

## 2014-03-24 DIAGNOSIS — H538 Other visual disturbances: Secondary | ICD-10-CM | POA: Diagnosis not present

## 2014-03-24 MED ORDER — HYDROMORPHONE HCL 1 MG/ML IJ SOLN
0.5000 mg | Freq: Once | INTRAMUSCULAR | Status: AC
  Administered 2014-03-24: 0.5 mg via INTRAVENOUS
  Filled 2014-03-24: qty 1

## 2014-03-24 MED ORDER — ONDANSETRON 4 MG PO TBDP
ORAL_TABLET | ORAL | Status: DC
Start: 2014-03-24 — End: 2015-11-20

## 2014-03-24 MED ORDER — NAPROXEN 500 MG PO TABS: 500.0000 mg | ORAL_TABLET | Freq: Two times a day (BID) | ORAL | Status: DC

## 2014-03-24 MED ORDER — HYDROCODONE-ACETAMINOPHEN 5-325 MG PO TABS: 1.0000 | ORAL_TABLET | Freq: Four times a day (QID) | ORAL | Status: DC | PRN

## 2014-03-24 MED ORDER — HYDROMORPHONE HCL 1 MG/ML IJ SOLN
1.0000 mg | Freq: Once | INTRAMUSCULAR | Status: AC
  Administered 2014-03-24: 1 mg via INTRAVENOUS
  Filled 2014-03-24: qty 1

## 2014-03-24 MED ORDER — ONDANSETRON HCL 4 MG/2ML IJ SOLN
4.0000 mg | Freq: Once | INTRAMUSCULAR | Status: AC
  Administered 2014-03-24: 4 mg via INTRAVENOUS
  Filled 2014-03-24: qty 2

## 2014-03-24 NOTE — ED Provider Notes (Signed)
CSN: [id number]     Arrival date & time 03/24/14  1806 History  This chart was scribed for Bethann Berkshire, MD by Gwenyth Ober, ED Scribe. This patient was seen in room APA17/APA17 and the patient's care was started at 6:41 PM.    Chief Complaint  Patient presents with  . Headache   Patient is a 34 y.o. male presenting with headaches. The history is provided by the patient. No language interpreter was used.  Headache Pain location:  R temporal Quality: Throbbing. Radiates to:  Does not radiate Severity currently:  Unable to specify Severity at highest:  Unable to specify Onset quality:  Gradual Duration:  4 days Timing:  Intermittent Progression:  Worsening Chronicity:  New Similar to prior headaches: no   Relieved by:  Prescription medications Worsened by:  Nothing Ineffective treatments:  None tried Associated symptoms: blurred vision, dizziness and nausea   Associated symptoms: no abdominal pain, no back pain, no congestion, no cough, no diarrhea, no fatigue, no seizures and no sinus pressure     HPI Comments: JAYD CADIEUX is a 34 y.o. male who presents to the Emergency Department complaining of constant, throbbing headache that started earlier today. He reports dizziness, nausea and blurred vision as associated symptoms. Pt was seen in the ED on 4 days ago for the same pain after a 3-5 lb. spool came off a textile machine and struck the right side of his head and right shoulder. He notes improvement to his symptoms with Percocet.  Past Medical History  Diagnosis Date  . Hypertension    Past Surgical History  Procedure Laterality Date  . Cholecystectomy    . Nerve surgery      Left elbow   Family History  Problem Relation Age of Onset  . Hypertension Mother    History  Substance Use Topics  . Smoking status: Current Every Day Smoker  . Smokeless tobacco: Not on file  . Alcohol Use: Yes    Review of Systems  Constitutional: Negative for appetite change and  fatigue.  HENT: Negative for congestion, ear discharge and sinus pressure.   Eyes: Positive for blurred vision. Negative for discharge.  Respiratory: Negative for cough.   Cardiovascular: Negative for chest pain.  Gastrointestinal: Positive for nausea. Negative for abdominal pain and diarrhea.  Genitourinary: Negative for frequency and hematuria.  Musculoskeletal: Negative for back pain.  Skin: Negative for rash.  Neurological: Positive for dizziness and headaches. Negative for seizures.  Psychiatric/Behavioral: Negative for hallucinations.      Allergies  Penicillins  Home Medications   Prior to Admission medications   Medication Sig Start Date End Date Taking? Authorizing Provider  HYDROcodone-acetaminophen (NORCO/VICODIN) 5-325 MG per tablet Take 2 tablets by mouth every 4 (four) hours as needed. 03/20/14   Eber Hong, MD  lisinopril (PRINIVIL,ZESTRIL) 20 MG tablet Take 20 mg by mouth daily.    Historical Provider, MD  Vitamin D, Ergocalciferol, (DRISDOL) 50000 UNITS CAPS capsule Take 50,000 Units by mouth.    Historical Provider, MD   BP 158/100 mmHg  Pulse 112  Temp(Src) 99 F (37.2 C) (Oral)  Resp 18  Ht  (1.854 m)  Wt 218 lb (98.884 kg)  BMI 28.77 kg/m2  SpO2 100% Physical Exam  Constitutional: He is oriented to person, place, and time. He appears well-developed.  HENT:  Head: Normocephalic.  Tenderness to right temple with a laceration that has been closed with staples; tenderness to right TMJ  Eyes: Conjunctivae and EOM are normal. No  scleral icterus.  Neck: Neck supple. No thyromegaly present.  Cardiovascular: Normal rate and regular rhythm.  Exam reveals no gallop and no friction rub.   No murmur heard. Pulmonary/Chest: No stridor. He has no wheezes. He has no rales. He exhibits no tenderness.  Abdominal: He exhibits no distension. There is no tenderness. There is no rebound.  Musculoskeletal: Normal range of motion. He exhibits no edema.   Lymphadenopathy:    He has no cervical adenopathy.  Neurological: He is oriented to person, place, and time. He exhibits normal muscle tone. Coordination normal.  Skin: No rash noted. No erythema.  Psychiatric: He has a normal mood and affect. His behavior is normal.  Nursing note and vitals reviewed.   ED Course  Procedures  DIAGNOSTIC STUDIES: Oxygen Saturation is 100% on RA, normal by my interpretation.    COORDINATION OF CARE: 6:45 PM Discussed treatment plan with pt at bedside and pt agreed to plan.    Labs Review Labs Reviewed - No data to display  Imaging Review No results found.   EKG Interpretation None      MDM   Final diagnoses:  None   Post concussion headache,  Refer to neurology and tx with rest, naprosyn, zofran and vicodin   The chart was scribed for me under my direct supervision.  I personally performed the history, physical, and medical decision making and all procedures in the evaluation of this patient.Bethann Berkshire.   Juliya Magill, MD 03/24/14 [phone]

## 2014-03-24 NOTE — ED Notes (Signed)
[facility]   3/12 for HI, staples in place, Today having blurred vision and headache, Nausea , no vomiting.

## 2014-09-14 ENCOUNTER — Encounter (HOSPITAL_COMMUNITY): Payer: Self-pay | Admitting: *Deleted

## 2014-09-14 ENCOUNTER — Emergency Department (HOSPITAL_COMMUNITY)
Admission: EM | Admit: 2014-09-14 | Discharge: 2014-09-15 | Disposition: A | Discharge status: Home / Self Care | Payer: Worker's Compensation | Attending: Emergency Medicine | Admitting: Emergency Medicine

## 2014-09-14 DIAGNOSIS — I10 Essential (primary) hypertension: Secondary | ICD-10-CM | POA: Diagnosis not present

## 2014-09-14 DIAGNOSIS — Z791 Long term (current) use of non-steroidal anti-inflammatories (NSAID): Secondary | ICD-10-CM | POA: Diagnosis not present

## 2014-09-14 DIAGNOSIS — Z880 Allergy status to penicillin: Secondary | ICD-10-CM | POA: Diagnosis not present

## 2014-09-14 DIAGNOSIS — G43909 Migraine, unspecified, not intractable, without status migrainosus: Secondary | ICD-10-CM | POA: Diagnosis not present

## 2014-09-14 DIAGNOSIS — Z720 Tobacco use: Secondary | ICD-10-CM | POA: Insufficient documentation

## 2014-09-14 DIAGNOSIS — R51 Headache: Secondary | ICD-10-CM | POA: Diagnosis present

## 2014-09-14 DIAGNOSIS — Z79899 Other long term (current) drug therapy: Secondary | ICD-10-CM | POA: Insufficient documentation

## 2014-09-14 DIAGNOSIS — G43009 Migraine without aura, not intractable, without status migrainosus: Secondary | ICD-10-CM

## 2014-09-14 MED ORDER — METOCLOPRAMIDE HCL 5 MG/ML IJ SOLN
10.0000 mg | Freq: Once | INTRAMUSCULAR | Status: AC
  Administered 2014-09-14: 10 mg via INTRAVENOUS
  Filled 2014-09-14: qty 2

## 2014-09-14 MED ORDER — KETOROLAC TROMETHAMINE 30 MG/ML IJ SOLN
30.0000 mg | Freq: Once | INTRAMUSCULAR | Status: AC
  Administered 2014-09-14: 30 mg via INTRAVENOUS
  Filled 2014-09-14: qty 1

## 2014-09-14 MED ORDER — DEXAMETHASONE SODIUM PHOSPHATE 10 MG/ML IJ SOLN
10.0000 mg | Freq: Once | INTRAMUSCULAR | Status: AC
  Administered 2014-09-14: 10 mg via INTRAVENOUS
  Filled 2014-09-14: qty 1

## 2014-09-14 MED ORDER — DIPHENHYDRAMINE HCL 50 MG/ML IJ SOLN
50.0000 mg | Freq: Once | INTRAMUSCULAR | Status: AC
  Administered 2014-09-14: 50 mg via INTRAVENOUS
  Filled 2014-09-14: qty 1

## 2014-09-14 MED ORDER — SODIUM CHLORIDE 0.9 % IV BOLUS (SEPSIS)
1000.0000 mL | Freq: Once | INTRAVENOUS | Status: AC
  Administered 2014-09-14: 1000 mL via INTRAVENOUS

## 2014-09-14 NOTE — ED Provider Notes (Signed)
This chart was scribed for Layla Maw Ward, DO by Budd Palmer, ED Scribe. This patient was seen in room APA05/APA05 and the patient's care was started at 11:21 PM.   TIME SEEN: 11:21 PM   CHIEF COMPLAINT: Headache  HPI: Darren Adams is a 34 y.o. male smoker at 0.5 ppd with a PMHx of HTN who presents to the Emergency Department complaining of a worsening, sharp, headache onset 8 hours ago. He reports associated pressure behind the eyes, ear ringing, photophobia, and generalized weakness. He notes a PMHx of concussion in March of this year and states he has seen a neurologist at [facility] for this. He states he has had several migraines since then. He has been on Inderal 2x daily for headaches. He has also tried taking buprofen topday with no relief. He denies any other recent head injuries. He is not on blood thinners. Pt denies one-sided numbness or weakness. He has had nausea. States he's had similar headaches. Describes as gradual onset, progressively worsening. Does not have any medication at home for breakthrough headaches. Significant other at bedside states he is only had 3 of these headaches where he is required medication at the hospital. States these headaches feel like his prior migraine headaches. Pt is allergic to penicillin.  ROS: See HPI Constitutional: no fever  Eyes: no drainage  ENT: no runny nose   Cardiovascular:  no chest pain  Resp: no SOB  GI: no vomiting GU: no dysuria Integumentary: no rash  Allergy: no hives  Musculoskeletal: no leg swelling  Neurological: no slurred speech ROS otherwise negative  PAST MEDICAL HISTORY/PAST SURGICAL HISTORY:  Past Medical History  Diagnosis Date  . Hypertension     MEDICATIONS:  Prior to Admission medications   Medication Sig Start Date End Date Taking? Authorizing Provider  HYDROcodone-acetaminophen (NORCO/VICODIN) 5-325 MG per tablet Take 1 tablet by mouth every 6 (six) hours as needed for moderate pain. 03/24/14   Bethann Berkshire, MD  ibuprofen (ADVIL,MOTRIN) 200 MG tablet Take 400 mg by mouth every 6 (six) hours as needed for moderate pain.    Historical Provider, MD  lisinopril (PRINIVIL,ZESTRIL) 20 MG tablet Take 20 mg by mouth daily.    Historical Provider, MD  naproxen (NAPROSYN) 500 MG tablet Take 1 tablet (500 mg total) by mouth 2 (two) times daily. 03/24/14   Bethann Berkshire, MD  ondansetron (ZOFRAN ODT) 4 MG disintegrating tablet 4mg  ODT q4 hours prn nausea/vomit 03/24/14   Bethann Berkshire, MD  oxyCODONE-acetaminophen (PERCOCET/ROXICET) 5-325 MG per tablet Take 1 tablet by mouth every 6 (six) hours as needed for severe pain.    Historical Provider, MD  Vitamin D, Ergocalciferol, (DRISDOL) 50000 UNITS CAPS capsule Take 50,000 Units by mouth every 7 (seven) days. Sunday.    Historical Provider, MD    ALLERGIES:  Allergies  Allergen Reactions  . Penicillins Rash    SOCIAL HISTORY:  Social History  Substance Use Topics  . Smoking status: Current Every Day Smoker -- 0.50 packs/day  . Smokeless tobacco: Not on file  . Alcohol Use: Yes    FAMILY HISTORY: Family History  Problem Relation Age of Onset  . Hypertension Mother     EXAM: BP 160/103 mmHg  Pulse 72  Temp(Src) 98.3 F (36.8 C) (Oral)  Resp 20  Ht 6\' 1"  (1.854 m)  Wt 240 lb (108.863 kg)  BMI 31.67 kg/m2  SpO2 100% CONSTITUTIONAL: Alert and oriented and responds appropriately to questions. Appears uncomfortable, but is afebrile and non-toxic; well-nourished. HEAD:  Normocephalic EYES: Conjunctivae clear, PERRL, Has photophobia ENT: normal nose; no rhinorrhea; moist mucous membranes; pharynx without lesions noted NECK: Supple, no meningismus, no LAD  CARD: RRR; S1 and S2 appreciated; no murmurs, no clicks, no rubs, no gallops RESP: Normal chest excursion without splinting or tachypnea; breath sounds clear and equal bilaterally; no wheezes, no rhonchi, no rales, no hypoxia or respiratory distress, speaking full sentences ABD/GI: Normal  bowel sounds; non-distended; soft, non-tender, no rebound, no guarding, no peritoneal signs BACK:  The back appears normal and is non-tender to palpation, there is no CVA tenderness EXT: Normal ROM in all joints; non-tender to palpation; no edema; normal capillary refill; no cyanosis, no calf tenderness or swelling    SKIN: Normal color for age and race; warm NEURO: Strength 5/5 in all 4 extremities. Moves all extremities equally, sensation to light touch intact diffusely, cranial nerves II through XII intact and no dysarthria or aphasia PSYCH: The patient's mood and manner are appropriate. Grooming and personal hygiene are appropriate.  MEDICAL DECISION MAKING: Patient here with migraine headache. Has had similar headaches in the past. Neurologically intact. Afebrile without meningismus. We'll give migraine cocktail including Toradol, Reglan, Benadryl, Decadron and IV fluids. I do not feel he needs emergent head imaging at this time.  ED PROGRESS: Headache improved after migraine cocktail but not completely gone. Given Imitrex which made headache worse. Given 1 dose of IV Ativan and patient reports feeling better. Headache is not completely, he feels he can be discharged home. We'll discharge with prescriptions for Fioricet and Phenergan. Have advised him to follow-up with his neurologist. Discussed return precautions. He verbalizes understanding and is comfortable with this plan.    I personally performed the services described in this documentation, which was scribed in my presence. The recorded information has been reviewed and is accurate.   Layla Maw Ward, DO 09/15/14 [phone]

## 2014-09-14 NOTE — ED Notes (Signed)
Pt c/o severe headache and feeling nauseous since 3pm today

## 2014-09-15 MED ORDER — SUMATRIPTAN SUCCINATE 6 MG/0.5ML ~~LOC~~ SOLN
6.0000 mg | Freq: Once | SUBCUTANEOUS | Status: AC
  Administered 2014-09-15: 6 mg via SUBCUTANEOUS
  Filled 2014-09-15: qty 0.5

## 2014-09-15 MED ORDER — PROMETHAZINE HCL 25 MG PO TABS: 25.0000 mg | ORAL_TABLET | Freq: Four times a day (QID) | ORAL | Status: DC | PRN

## 2014-09-15 MED ORDER — LORAZEPAM 2 MG/ML IJ SOLN
1.0000 mg | Freq: Once | INTRAMUSCULAR | Status: AC
  Administered 2014-09-15: 1 mg via INTRAVENOUS
  Filled 2014-09-15: qty 1

## 2014-09-15 MED ORDER — BUTALBITAL-APAP-CAFFEINE 50-325-40 MG PO TABS: 1.0000 | ORAL_TABLET | Freq: Four times a day (QID) | ORAL | Status: DC | PRN

## 2014-09-15 NOTE — Discharge Instructions (Signed)
Migraine Headache A migraine headache is an intense, throbbing pain on one or both sides of your head. A migraine can last for 30 minutes to several hours. CAUSES  The exact cause of a migraine headache is not always known. However, a migraine may be caused when nerves in the brain become irritated and release chemicals that cause inflammation. This causes pain. Certain things may also trigger migraines, such as:  Alcohol.  Smoking.  Stress.  Menstruation.  Aged cheeses.  Foods or drinks that contain nitrates, glutamate, aspartame, or tyramine.  Lack of sleep.  Chocolate.  Caffeine.  Hunger.  Physical exertion.  Fatigue.  Medicines used to treat chest pain (nitroglycerine), birth control pills, estrogen, and some blood pressure medicines. SIGNS AND SYMPTOMS  Pain on one or both sides of your head.  Pulsating or throbbing pain.  Severe pain that prevents daily activities.  Pain that is aggravated by any physical activity.  Nausea, vomiting, or both.  Dizziness.  Pain with exposure to bright lights, loud noises, or activity.  General sensitivity to bright lights, loud noises, or smells. Before you get a migraine, you may get warning signs that a migraine is coming (aura). An aura may include:  Seeing flashing lights.  Seeing bright spots, halos, or zigzag lines.  Having tunnel vision or blurred vision.  Having feelings of numbness or tingling.  Having trouble talking.  Having muscle weakness. DIAGNOSIS  A migraine headache is often diagnosed based on:  Symptoms.  Physical exam.  A CT scan or MRI of your head. These imaging tests cannot diagnose migraines, but they can help rule out other causes of headaches. TREATMENT Medicines may be given for pain and nausea. Medicines can also be given to help prevent recurrent migraines.  HOME CARE INSTRUCTIONS  Only take over-the-counter or prescription medicines for pain or discomfort as directed by your  health care provider. The use of long-term narcotics is not recommended.  Lie down in a dark, quiet room when you have a migraine.  Keep a journal to find out what may trigger your migraine headaches. For example, write down:  What you eat and drink.  How much sleep you get.  Any change to your diet or medicines.  Limit alcohol consumption.  Quit smoking if you smoke.  Get 7-9 hours of sleep, or as recommended by your health care provider.  Limit stress.  Keep lights dim if bright lights bother you and make your migraines worse. SEEK IMMEDIATE MEDICAL CARE IF:   Your migraine becomes severe.  You have a fever.  You have a stiff neck.  You have vision loss.  You have muscular weakness or loss of muscle control.  You start losing your balance or have trouble walking.  You feel faint or pass out.  You have severe symptoms that are different from your first symptoms. MAKE SURE YOU:   Understand these instructions.  Will watch your condition.  Will get help right away if you are not doing well or get worse. Document Released: 12/25/2004 Document Revised: 05/11/2013 Document Reviewed: 09/01/2012 ExitCare Patient Information 2015 [employer]. This information is not intended to replace advice given to you by your health care provider. Make sure you discuss any questions you have with your health care provider. Post-Concussion Syndrome Post-concussion syndrome describes the symptoms that can occur after a head injury. These symptoms can last from weeks to months. CAUSES  It is not clear why some head injuries cause post-concussion syndrome. It can occur whether your   head injury was mild or severe and whether you were wearing head protection or not.  SIGNS AND SYMPTOMS  Memory difficulties.  Dizziness.  Headaches.  Double vision or blurry vision.  Sensitivity to light.  Hearing difficulties.  Depression.  Tiredness.  Weakness.  Difficulty with  concentration.  Difficulty sleeping or staying asleep.  Vomiting.  Poor balance or instability on your feet.  Slow reaction time.  Difficulty learning and remembering things you have heard. DIAGNOSIS  There is no test to determine whether you have post-concussion syndrome. Your health care provider may order an imaging scan of your brain, such as a CT scan, to check for other problems that may be causing your symptoms (such as severe injury inside your skull). TREATMENT  Usually, these problems disappear over time without medical care. Your health care provider may prescribe medicine to help ease your symptoms. It is important to follow up with a neurologist to evaluate your recovery and address any lingering symptoms or issues. HOME CARE INSTRUCTIONS   Only take over-the-counter or prescription medicines for pain, discomfort, or fever as directed by your health care provider. Do not take aspirin. Aspirin can slow blood clotting.  Sleep with your head slightly elevated to help with headaches.  Avoid any situation where there is potential for another head injury (football, hockey, soccer, basketball, martial arts, downhill snow sports, and horseback riding). Your condition will get worse every time you experience a concussion. You should avoid these activities until you are evaluated by the appropriate follow-up health care providers.  Keep all follow-up appointments as directed by your health care provider. SEEK IMMEDIATE MEDICAL CARE IF:  You develop confusion or unusual drowsiness.  You cannot wake the injured person.  You develop nausea or persistent, forceful vomiting.  You feel like you are moving when you are not (vertigo).  You notice the injured person's eyes moving rapidly back and forth. This may be a sign of vertigo.  You have convulsions or faint.  You have severe, persistent headaches that are not relieved by medicine.  You cannot use your arms or legs  normally.  Your pupils change size.  You have clear or bloody discharge from the nose or ears.  Your problems are getting worse, not better. MAKE SURE YOU:  Understand these instructions.  Will watch your condition.  Will get help right away if you are not doing well or get worse. Document Released: 06/16/2001 Document Revised: 10/15/2012 Document Reviewed: 04/01/2013 ExitCare Patient Information 2015 [employer]. This information is not intended to replace advice given to you by your health care provider. Make sure you discuss any questions you have with your health care provider.  

## 2014-09-15 NOTE — ED Notes (Signed)
Patient states that he is feeling worse after the imitrex. Dr. Elesa Massed notified.

## 2014-09-16 ENCOUNTER — Emergency Department (HOSPITAL_COMMUNITY): Payer: Worker's Compensation

## 2014-09-16 ENCOUNTER — Encounter (HOSPITAL_COMMUNITY): Payer: Self-pay | Admitting: Emergency Medicine

## 2014-09-16 ENCOUNTER — Emergency Department (HOSPITAL_COMMUNITY)
Admission: EM | Admit: 2014-09-16 | Discharge: 2014-09-16 | Disposition: A | Discharge status: Home / Self Care | Payer: Worker's Compensation | Attending: Emergency Medicine | Admitting: Emergency Medicine

## 2014-09-16 DIAGNOSIS — R0789 Other chest pain: Secondary | ICD-10-CM | POA: Diagnosis not present

## 2014-09-16 DIAGNOSIS — G44309 Post-traumatic headache, unspecified, not intractable: Secondary | ICD-10-CM | POA: Insufficient documentation

## 2014-09-16 DIAGNOSIS — F0781 Postconcussional syndrome: Secondary | ICD-10-CM | POA: Insufficient documentation

## 2014-09-16 DIAGNOSIS — R519 Headache, unspecified: Secondary | ICD-10-CM

## 2014-09-16 DIAGNOSIS — Z79899 Other long term (current) drug therapy: Secondary | ICD-10-CM | POA: Insufficient documentation

## 2014-09-16 DIAGNOSIS — Z720 Tobacco use: Secondary | ICD-10-CM | POA: Insufficient documentation

## 2014-09-16 DIAGNOSIS — I10 Essential (primary) hypertension: Secondary | ICD-10-CM | POA: Insufficient documentation

## 2014-09-16 DIAGNOSIS — R112 Nausea with vomiting, unspecified: Secondary | ICD-10-CM | POA: Diagnosis present

## 2014-09-16 DIAGNOSIS — Z880 Allergy status to penicillin: Secondary | ICD-10-CM | POA: Diagnosis not present

## 2014-09-16 DIAGNOSIS — R51 Headache: Secondary | ICD-10-CM

## 2014-09-16 MED ORDER — PROMETHAZINE HCL 25 MG/ML IJ SOLN
25.0000 mg | Freq: Once | INTRAMUSCULAR | Status: AC
  Administered 2014-09-16: 25 mg via INTRAVENOUS
  Filled 2014-09-16: qty 1

## 2014-09-16 MED ORDER — VALPROATE SODIUM 500 MG/5ML IV SOLN
INTRAVENOUS | Status: AC
  Filled 2014-09-16: qty 5

## 2014-09-16 MED ORDER — SODIUM CHLORIDE 0.9 % IV BOLUS (SEPSIS)
500.0000 mL | Freq: Once | INTRAVENOUS | Status: AC
  Administered 2014-09-16: 500 mL via INTRAVENOUS

## 2014-09-16 MED ORDER — VALPROATE SODIUM 500 MG/5ML IV SOLN
500.0000 mg | Freq: Once | INTRAVENOUS | Status: AC
  Administered 2014-09-16: 500 mg via INTRAVENOUS
  Filled 2014-09-16: qty 5

## 2014-09-16 MED ORDER — METOCLOPRAMIDE HCL 5 MG/ML IJ SOLN
10.0000 mg | Freq: Once | INTRAMUSCULAR | Status: AC
  Administered 2014-09-16: 10 mg via INTRAVENOUS
  Filled 2014-09-16: qty 2

## 2014-09-16 MED ORDER — BUTALBITAL-APAP-CAFFEINE 50-325-40 MG PO TABS: 1.0000 | ORAL_TABLET | Freq: Four times a day (QID) | ORAL | Status: AC | PRN

## 2014-09-16 MED ORDER — FENTANYL CITRATE (PF) 100 MCG/2ML IJ SOLN
50.0000 ug | Freq: Once | INTRAMUSCULAR | Status: AC
  Administered 2014-09-16: 50 ug via INTRAVENOUS
  Filled 2014-09-16: qty 2

## 2014-09-16 MED ORDER — ONDANSETRON HCL 4 MG/2ML IJ SOLN
4.0000 mg | Freq: Once | INTRAMUSCULAR | Status: AC
  Administered 2014-09-16: 4 mg via INTRAVENOUS
  Filled 2014-09-16: qty 2

## 2014-09-16 MED ORDER — KETOROLAC TROMETHAMINE 30 MG/ML IJ SOLN
30.0000 mg | Freq: Once | INTRAMUSCULAR | Status: AC
  Administered 2014-09-16: 30 mg via INTRAVENOUS
  Filled 2014-09-16: qty 1

## 2014-09-16 MED ORDER — DIPHENHYDRAMINE HCL 50 MG/ML IJ SOLN
25.0000 mg | Freq: Once | INTRAMUSCULAR | Status: AC
  Administered 2014-09-16: 25 mg via INTRAVENOUS
  Filled 2014-09-16: qty 1

## 2014-09-16 NOTE — ED Notes (Signed)
Patient verbalizes understanding of discharge instructions, prescription medications, home care and follow up care. Patient ambulatory out of department at this time. 

## 2014-09-16 NOTE — ED Notes (Signed)
Patient states "the medicine has helped some, but my head still hurts when I move around" patient resting in bed, family at bedside. No needs voiced at this time.

## 2014-09-16 NOTE — ED Notes (Signed)
Patient complaining of headache and emesis that started today. States has had trouble with migraines since recent head injury and loss of conciousness a few months ago.

## 2014-09-16 NOTE — ED Provider Notes (Signed)
CSN: [id number]     Arrival date & time 09/16/14  1908 History   First MD Initiated Contact with Patient 09/16/14 1931     Chief Complaint  Patient presents with  . Emesis  . Headache     (Consider location/radiation/quality/duration/timing/severity/associated sxs/prior Treatment) Patient is a 34 y.o. male presenting with vomiting and headaches. The history is provided by the patient.  Emesis Associated symptoms: headaches   Associated symptoms: no arthralgias   Headache Associated symptoms: photophobia and vomiting   Associated symptoms: no fever    patient presents with headache nausea and vomiting. Has had a history of postconcussion syndrome since a head injury several months ago. Had gone back to work but the headaches began fairly recently. States he got a headache after that. Has throbbing on the left side of his head that went over his whole head. This headache started the same. He was seen in the ER couple days ago and was completely relieved with a migraine cocktail. States the pain began back up today. No fevers. He has some photophobia. He also has chest pain after the vomiting. No fevers. No chills. No new trauma. Chest pain is sharp on the left side of his chest.  Past Medical History  Diagnosis Date  . Hypertension    Past Surgical History  Procedure Laterality Date  . Cholecystectomy    . Nerve surgery      Left elbow   Family History  Problem Relation Age of Onset  . Hypertension Mother    Social History  Substance Use Topics  . Smoking status: Current Every Day Smoker -- 0.50 packs/day  . Smokeless tobacco: None  . Alcohol Use: Yes     Comment: occasional    Review of Systems  Constitutional: Negative for fever.  HENT: Negative for facial swelling.   Eyes: Positive for photophobia. Negative for visual disturbance.  Respiratory: Negative for shortness of breath.   Cardiovascular: Positive for chest pain.  Gastrointestinal: Positive for vomiting.   Genitourinary: Negative for hematuria.  Musculoskeletal: Negative for arthralgias.  Skin: Negative for wound.  Neurological: Positive for headaches.      Allergies  Penicillins  Home Medications   Prior to Admission medications   Medication Sig Start Date End Date Taking? Authorizing Provider  lisinopril (PRINIVIL,ZESTRIL) 20 MG tablet Take 20 mg by mouth daily.   Yes Historical Provider, MD  propranolol (INDERAL) 60 MG tablet Take 60 mg by mouth daily.   Yes Historical Provider, MD  butalbital-acetaminophen-caffeine (FIORICET) 50-325-40 MG per tablet Take 1-2 tablets by mouth every 6 (six) hours as needed for headache. 09/16/14 09/16/15  Eber Hong, MD  HYDROcodone-acetaminophen (NORCO/VICODIN) 5-325 MG per tablet Take 1 tablet by mouth every 6 (six) hours as needed for moderate pain. Patient not taking: Reported on 09/16/2014 03/24/14   Bethann Berkshire, MD  ibuprofen (ADVIL,MOTRIN) 200 MG tablet Take 400 mg by mouth every 6 (six) hours as needed for moderate pain.    Historical Provider, MD  naproxen (NAPROSYN) 500 MG tablet Take 1 tablet (500 mg total) by mouth 2 (two) times daily. Patient not taking: Reported on 09/16/2014 03/24/14   Bethann Berkshire, MD  ondansetron (ZOFRAN ODT) 4 MG disintegrating tablet 4mg  ODT q4 hours prn nausea/vomit Patient not taking: Reported on 09/16/2014 03/24/14   Bethann Berkshire, MD  promethazine (PHENERGAN) 25 MG tablet Take 1 tablet (25 mg total) by mouth every 6 (six) hours as needed for nausea or vomiting. Patient not taking: Reported on 09/16/2014 09/15/14  Kristen N Ward, DO   BP 133/83 mmHg  Pulse 78  Temp(Src) 98 F (36.7 C) (Oral)  Resp 18  SpO2 98% Physical Exam  Constitutional: He appears well-developed.  HENT:  Head: Atraumatic.  Eyes: EOM are normal. Pupils are equal, round, and reactive to light.  Neck: Neck supple.  Cardiovascular: Normal rate.   Pulmonary/Chest: Effort normal. He exhibits tenderness.  Mild tenderness left anterior chest wall.   Abdominal: Soft.  Neurological: He is alert.  Skin: Skin is warm.    ED Course  Procedures (including critical care time) Labs Review Labs Reviewed - No data to display  Imaging Review No results found. I have personally reviewed and evaluated these images and lab results as part of my medical decision-making.   EKG Interpretation None      MDM   Final diagnoses:  Postconcussion syndrome  Acute nonintractable headache, unspecified headache type    Patient with headache. Likely postconcussion. Reimaged since headache began slightly after the injury. Feels better after treatment will be discharged home.    Benjiman Core, MD 09/22/14 [phone]

## 2014-09-16 NOTE — ED Provider Notes (Signed)
The patient has improved with medications given, he still has a headache, his CT scan was unremarkable, given a prescription for Fioricet which the patient states he has never taken. We'll give a dose of Reglan prior to discharge. Neurologically the patient is at baseline, family member attests to this.  Meds given in ED:  Medications  metoCLOPramide (REGLAN) injection 10 mg (not administered)  ondansetron (ZOFRAN) injection 4 mg (not administered)  sodium chloride 0.9 % bolus 500 mL (0 mLs Intravenous Stopped 09/16/14 2126)  promethazine (PHENERGAN) injection 25 mg (25 mg Intravenous Given 09/16/14 2002)  ketorolac (TORADOL) 30 MG/ML injection 30 mg (30 mg Intravenous Given 09/16/14 2002)  diphenhydrAMINE (BENADRYL) injection 25 mg (25 mg Intravenous Given 09/16/14 2002)  fentaNYL (SUBLIMAZE) injection 50 mcg (50 mcg Intravenous Given 09/16/14 2128)  valproate (DEPACON) 500 mg in dextrose 5 % 50 mL IVPB (0 mg Intravenous Stopped 09/16/14 2243)    New Prescriptions   BUTALBITAL-ACETAMINOPHEN-CAFFEINE (FIORICET) 50-325-40 MG PER TABLET    Take 1-2 tablets by mouth every 6 (six) hours as needed for headache.      Eber Hong, MD 09/16/14 2312

## 2014-09-16 NOTE — Discharge Instructions (Signed)
Post-Concussion Syndrome Post-concussion syndrome describes the symptoms that can occur after a head injury. These symptoms can last from weeks to months. CAUSES  It is not clear why some head injuries cause post-concussion syndrome. It can occur whether your head injury was mild or severe and whether you were wearing head protection or not.  SIGNS AND SYMPTOMS  Memory difficulties.  Dizziness.  Headaches.  Double vision or blurry vision.  Sensitivity to light.  Hearing difficulties.  Depression.  Tiredness.  Weakness.  Difficulty with concentration.  Difficulty sleeping or staying asleep.  Vomiting.  Poor balance or instability on your feet.  Slow reaction time.  Difficulty learning and remembering things you have heard. DIAGNOSIS  There is no test to determine whether you have post-concussion syndrome. Your health care provider may order an imaging scan of your brain, such as a CT scan, to check for other problems that may be causing your symptoms (such as severe injury inside your skull). TREATMENT  Usually, these problems disappear over time without medical care. Your health care provider may prescribe medicine to help ease your symptoms. It is important to follow up with a neurologist to evaluate your recovery and address any lingering symptoms or issues. HOME CARE INSTRUCTIONS   Only take over-the-counter or prescription medicines for pain, discomfort, or fever as directed by your health care provider. Do not take aspirin. Aspirin can slow blood clotting.  Sleep with your head slightly elevated to help with headaches.  Avoid any situation where there is potential for another head injury (football, hockey, soccer, basketball, martial arts, downhill snow sports, and horseback riding). Your condition will get worse every time you experience a concussion. You should avoid these activities until you are evaluated by the appropriate follow-up health care  providers.  Keep all follow-up appointments as directed by your health care provider. SEEK IMMEDIATE MEDICAL CARE IF:  You develop confusion or unusual drowsiness.  You cannot wake the injured person.  You develop nausea or persistent, forceful vomiting.  You feel like you are moving when you are not (vertigo).  You notice the injured person's eyes moving rapidly back and forth. This may be a sign of vertigo.  You have convulsions or faint.  You have severe, persistent headaches that are not relieved by medicine.  You cannot use your arms or legs normally.  Your pupils change size.  You have clear or bloody discharge from the nose or ears.  Your problems are getting worse, not better. MAKE SURE YOU:  Understand these instructions.  Will watch your condition.  Will get help right away if you are not doing well or get worse. Document Released: 06/16/2001 Document Revised: 10/15/2012 Document Reviewed: 04/01/2013 Exit[employer] Patient Information 2015 [employer]. This information is not intended to replace advice given to you by your health care provider. Make sure you discuss any questions you have with your health care provider.  

## 2015-11-20 ENCOUNTER — Encounter (HOSPITAL_COMMUNITY): Payer: Self-pay | Admitting: *Deleted

## 2015-11-20 ENCOUNTER — Emergency Department (HOSPITAL_COMMUNITY): Payer: Self-pay

## 2015-11-20 ENCOUNTER — Emergency Department (HOSPITAL_COMMUNITY)
Admission: EM | Admit: 2015-11-20 | Discharge: 2015-11-20 | Disposition: A | Discharge status: Home / Self Care | Payer: Self-pay | Attending: Emergency Medicine | Admitting: Emergency Medicine

## 2015-11-20 DIAGNOSIS — F17200 Nicotine dependence, unspecified, uncomplicated: Secondary | ICD-10-CM | POA: Insufficient documentation

## 2015-11-20 DIAGNOSIS — I10 Essential (primary) hypertension: Secondary | ICD-10-CM | POA: Insufficient documentation

## 2015-11-20 DIAGNOSIS — R319 Hematuria, unspecified: Secondary | ICD-10-CM | POA: Insufficient documentation

## 2015-11-20 DIAGNOSIS — R109 Unspecified abdominal pain: Secondary | ICD-10-CM | POA: Insufficient documentation

## 2015-11-20 DIAGNOSIS — R110 Nausea: Secondary | ICD-10-CM | POA: Insufficient documentation

## 2015-11-20 DIAGNOSIS — Z79899 Other long term (current) drug therapy: Secondary | ICD-10-CM | POA: Insufficient documentation

## 2015-11-20 HISTORY — DX: Personal history of urinary calculi: Z87.442

## 2015-11-20 LAB — URINALYSIS, ROUTINE W REFLEX MICROSCOPIC
BILIRUBIN URINE: NEGATIVE
Glucose, UA: NEGATIVE mg/dL
HGB URINE DIPSTICK: NEGATIVE
KETONES UR: NEGATIVE mg/dL
Leukocytes, UA: NEGATIVE
NITRITE: NEGATIVE
PH: 6.5 (ref 5.0–8.0)
Protein, ur: NEGATIVE mg/dL
Specific Gravity, Urine: <1.005 — ABNORMAL LOW (ref 1.005–1.030)

## 2015-11-20 MED ORDER — METHOCARBAMOL 500 MG PO TABS: 1000.0000 mg | ORAL_TABLET | Freq: Four times a day (QID) | ORAL | 0 refills | Status: DC | PRN

## 2015-11-20 MED ORDER — NAPROXEN 250 MG PO TABS: 250.0000 mg | ORAL_TABLET | Freq: Two times a day (BID) | ORAL | 0 refills | Status: DC | PRN

## 2015-11-20 MED ORDER — HYDROCODONE-ACETAMINOPHEN 5-325 MG PO TABS: ORAL_TABLET | ORAL | 0 refills | Status: DC

## 2015-11-20 MED ORDER — IBUPROFEN 400 MG PO TABS
400.0000 mg | ORAL_TABLET | Freq: Once | ORAL | Status: AC
  Administered 2015-11-20: 400 mg via ORAL
  Filled 2015-11-20: qty 1

## 2015-11-20 MED ORDER — OXYCODONE-ACETAMINOPHEN 5-325 MG PO TABS
2.0000 | ORAL_TABLET | Freq: Once | ORAL | Status: AC
  Administered 2015-11-20: 2 via ORAL
  Filled 2015-11-20: qty 2

## 2015-11-20 MED ORDER — ONDANSETRON 8 MG PO TBDP
8.0000 mg | ORAL_TABLET | Freq: Once | ORAL | Status: AC
  Administered 2015-11-20: 8 mg via ORAL
  Filled 2015-11-20: qty 1

## 2015-11-20 MED ORDER — ONDANSETRON HCL 4 MG PO TABS: 4.0000 mg | ORAL_TABLET | Freq: Three times a day (TID) | ORAL | 0 refills | Status: DC | PRN

## 2015-11-20 NOTE — ED Provider Notes (Signed)
[facility]-EMERGENCY DEPT Provider Note   CSN: [id number] Arrival date & time: 11/20/15  1739     History   Chief Complaint Chief Complaint  Patient presents with  . Flank Pain    HPI Darren Adams is a 35 y.o. male.  HPI  Pt was seen at 1805. Per pt, c/o sudden onset and persistence of waxing and waning left sided flank "pain" that began yesterday.  Pt describes the pain as "like my last kidney stone," and radiating into the left side of his abd.  Has been associated with nausea and hematuria.  Denies testicular pain/swelling, no dysuria, no abd pain, no vomiting/diarrhea, no CP/SOB, no fevers, no rash.    Past Medical History:  Diagnosis Date  . History of kidney stones   . Hypertension     There are no active problems to display for this patient.   Past Surgical History:  Procedure Laterality Date  . CHOLECYSTECTOMY    . LITHOTRIPSY    . NERVE SURGERY     Left elbow       Home Medications    Prior to Admission medications   Medication Sig Start Date End Date Taking? Authorizing Provider  HYDROcodone-acetaminophen (NORCO/VICODIN) 5-325 MG per tablet Take 1 tablet by mouth every 6 (six) hours as needed for moderate pain. Patient not taking: Reported on 09/16/2014 03/24/14   Bethann BerkshireJoseph Zammit, MD  ibuprofen (ADVIL,MOTRIN) 200 MG tablet Take 400 mg by mouth every 6 (six) hours as needed for moderate pain.    Historical Provider, MD  lisinopril (PRINIVIL,ZESTRIL) 20 MG tablet Take 20 mg by mouth daily.    Historical Provider, MD  naproxen (NAPROSYN) 500 MG tablet Take 1 tablet (500 mg total) by mouth 2 (two) times daily. Patient not taking: Reported on 09/16/2014 03/24/14   Bethann BerkshireJoseph Zammit, MD  ondansetron (ZOFRAN ODT) 4 MG disintegrating tablet 4mg  ODT q4 hours prn nausea/vomit Patient not taking: Reported on 09/16/2014 03/24/14   Bethann BerkshireJoseph Zammit, MD  promethazine (PHENERGAN) 25 MG tablet Take 1 tablet (25 mg total) by mouth every 6 (six) hours as needed for nausea or  vomiting. Patient not taking: Reported on 09/16/2014 09/15/14   Layla MawKristen N Ward, DO  propranolol (INDERAL) 60 MG tablet Take 60 mg by mouth daily.    Historical Provider, MD    Family History Family History  Problem Relation Age of Onset  . Hypertension Mother     Social History Social History  Substance Use Topics  . Smoking status: Current Every Day Smoker    Packs/day: 0.50  . Smokeless tobacco: Never Used  . Alcohol use Yes     Comment: occasional     Allergies   Penicillins   Review of Systems Review of Systems ROS: Statement: All systems negative except as marked or noted in the HPI; Constitutional: Negative for fever and chills. ; ; Eyes: Negative for eye pain, redness and discharge. ; ; ENMT: Negative for ear pain, hoarseness, nasal congestion, sinus pressure and sore throat. ; ; Cardiovascular: Negative for chest pain, palpitations, diaphoresis, dyspnea and peripheral edema. ; ; Respiratory: Negative for cough, wheezing and stridor. ; ; Gastrointestinal: +nausea. Negative for vomiting, diarrhea, abdominal pain, blood in stool, hematemesis, jaundice and rectal bleeding. . ; ; Genitourinary: Negative for dysuria. +flank pain and hematuria. ; ; Genital:  No penile drainage or rash, no testicular pain or swelling, no scrotal rash or swelling. ;; Musculoskeletal: Negative for back pain and neck pain. Negative for swelling and trauma.; ; Skin: Negative  for pruritus, rash, abrasions, blisters, bruising and skin lesion.; ; Neuro: Negative for headache, lightheadedness and neck stiffness. Negative for weakness, altered level of consciousness, altered mental status, extremity weakness, paresthesias, involuntary movement, seizure and syncope.       Physical Exam Updated Vital Signs BP 166/91 (BP Location: Left Arm)   Pulse 92   Temp 98.5 F (36.9 C) (Oral)   Resp 18   Ht 6\' 1"  (1.854 m)   Wt 230 lb (104.3 kg)   SpO2 99%   BMI 30.34 kg/m   Physical Exam 1810: Physical  examination:  Nursing notes reviewed; Vital signs and O2 SAT reviewed;  Constitutional: Well developed, Well nourished, Well hydrated, In no acute distress; Head:  Normocephalic, atraumatic; Eyes: EOMI, PERRL, No scleral icterus; ENMT: Mouth and pharynx normal, Mucous membranes moist; Neck: Supple, Full range of motion, No lymphadenopathy; Cardiovascular: Regular rate and rhythm, No gallop; Respiratory: Breath sounds clear & equal bilaterally, No wheezes.  Speaking full sentences with ease, Normal respiratory effort/excursion; Chest: Nontender, Movement normal; Abdomen: Soft, Nontender, Nondistended, Normal bowel sounds; Genitourinary: No CVA tenderness; Spine:  No midline CS, TS, LS tenderness. +TTP left lumbar paraspinal muscles.;; Extremities: Pulses normal, No tenderness, No edema, No calf edema or asymmetry.; Neuro: AA&Ox3, Major CN grossly intact.  Speech clear. No gross focal motor or sensory deficits in extremities. Climbs on and off stretcher easily by himself. Gait steady.; Skin: Color normal, Warm, Dry.   ED Treatments / Results  Labs (all labs ordered are listed, but only abnormal results are displayed)   EKG  EKG Interpretation None       Radiology   Procedures Procedures (including critical care time)  Medications Ordered in ED Medications  ondansetron (ZOFRAN-ODT) disintegrating tablet 8 mg (not administered)  oxyCODONE-acetaminophen (PERCOCET/ROXICET) 5-325 MG per tablet 2 tablet (not administered)  ibuprofen (ADVIL,MOTRIN) tablet 400 mg (not administered)     Initial Impression / Assessment and Plan / ED Course  I have reviewed the triage vital signs and the nursing notes.  Pertinent labs & imaging results that were available during my care of the patient were reviewed by me and considered in my medical decision making (see chart for details).  MDM Reviewed: previous chart, nursing note and vitals Interpretation: labs and CT scan   Results for orders placed or  performed during the hospital encounter of 11/20/15  Urinalysis, Routine w reflex microscopic  Result Value Ref Range   Color, Urine YELLOW YELLOW   APPearance CLEAR CLEAR   Specific Gravity, Urine <1.005 (L) 1.005 - 1.030   pH 6.5 5.0 - 8.0   Glucose, UA NEGATIVE NEGATIVE mg/dL   Hgb urine dipstick NEGATIVE NEGATIVE   Bilirubin Urine NEGATIVE NEGATIVE   Ketones, ur NEGATIVE NEGATIVE mg/dL   Protein, ur NEGATIVE NEGATIVE mg/dL   Nitrite NEGATIVE NEGATIVE   Leukocytes, UA NEGATIVE NEGATIVE   Ct Renal Stone Study Result Date: 11/20/2015 CLINICAL DATA:  Acute onset of sharp left flank pain and pressure when urinating. Hematuria. Mild nausea. Initial encounter. EXAM: CT ABDOMEN AND PELVIS WITHOUT CONTRAST TECHNIQUE: Multidetector CT imaging of the abdomen and pelvis was performed following the standard protocol without IV contrast. COMPARISON:  CT of the abdomen and pelvis from 03/16/2015 FINDINGS: Lower chest: The visualized lung bases are grossly clear. The visualized portions of the mediastinum are unremarkable. Hepatobiliary: The liver is unremarkable in appearance. The patient is status post cholecystectomy, with clips noted at the gallbladder fossa. The common bile duct remains normal in caliber. Pancreas: The  pancreas is within normal limits. Spleen: The spleen is unremarkable in appearance. Adrenals/Urinary Tract: The adrenal glands are unremarkable in appearance. The kidneys are within normal limits. There is no evidence of hydronephrosis. No renal or ureteral stones are identified. No perinephric stranding is seen. Stomach/Bowel: The stomach is unremarkable in appearance. The small bowel is within normal limits. The appendix is normal in caliber, without evidence of appendicitis. The colon is unremarkable in appearance. Vascular/Lymphatic: The abdominal aorta is unremarkable in appearance. The inferior vena cava is grossly unremarkable. No retroperitoneal lymphadenopathy is seen. No pelvic  sidewall lymphadenopathy is identified. Reproductive: The bladder is mildly distended and grossly unremarkable. The prostate remains normal in size. Other: No additional soft tissue abnormalities are seen. Musculoskeletal: No acute osseous abnormalities are identified. The visualized musculature is unremarkable in appearance. IMPRESSION: Unremarkable noncontrast CT of the abdomen and pelvis. Electronically Signed   By: Roanna Raider M.D.   On: 11/20/2015 19:35    2025:  Workup reassuring. Tx symptomatically. Dx and testing d/w pt and family.  Questions answered.  Verb understanding, agreeable to d/c home with outpt f/u.     Final Clinical Impressions(s) / ED Diagnoses   Final diagnoses:  None    New Prescriptions New Prescriptions   No medications on file     Samuel Jester, DO 11/23/15 1055

## 2015-11-20 NOTE — ED Triage Notes (Signed)
Pt reports left flank pain, pressure when urinating and hematuria since last night. Denies fever. Reports slight nausea but no vomiting.

## 2015-11-20 NOTE — Discharge Instructions (Signed)
Take the prescriptions as directed.  Apply moist heat or ice to the area(s) of discomfort, for 15 minutes at a time, several times per day for the next few days.  Do not fall asleep on a heating or ice pack.  Call your regular medical doctor tomorrow to schedule a follow up appointment in the next 2 days.  Return to the Emergency Department immediately if worsening. ° °

## 2015-11-22 LAB — URINE CULTURE: CULTURE: NO GROWTH

## 2017-02-20 ENCOUNTER — Other Ambulatory Visit: Payer: Self-pay

## 2017-02-20 ENCOUNTER — Inpatient Hospital Stay (HOSPITAL_COMMUNITY)
Admission: EM | Admit: 2017-02-20 | Discharge: 2017-02-22 | DRG: 440 | Discharge status: Against Medical Advice | Payer: 59 | Attending: Internal Medicine | Admitting: Internal Medicine

## 2017-02-20 ENCOUNTER — Encounter (HOSPITAL_COMMUNITY): Payer: Self-pay | Admitting: Emergency Medicine

## 2017-02-20 DIAGNOSIS — Z765 Malingerer [conscious simulation]: Secondary | ICD-10-CM

## 2017-02-20 DIAGNOSIS — R7401 Elevation of levels of liver transaminase levels: Secondary | ICD-10-CM | POA: Diagnosis present

## 2017-02-20 DIAGNOSIS — R112 Nausea with vomiting, unspecified: Secondary | ICD-10-CM | POA: Diagnosis not present

## 2017-02-20 DIAGNOSIS — Z720 Tobacco use: Secondary | ICD-10-CM | POA: Diagnosis present

## 2017-02-20 DIAGNOSIS — F17210 Nicotine dependence, cigarettes, uncomplicated: Secondary | ICD-10-CM | POA: Diagnosis present

## 2017-02-20 DIAGNOSIS — Z880 Allergy status to penicillin: Secondary | ICD-10-CM

## 2017-02-20 DIAGNOSIS — I10 Essential (primary) hypertension: Secondary | ICD-10-CM | POA: Diagnosis not present

## 2017-02-20 DIAGNOSIS — K861 Other chronic pancreatitis: Principal | ICD-10-CM | POA: Diagnosis present

## 2017-02-20 DIAGNOSIS — K858 Other acute pancreatitis without necrosis or infection: Secondary | ICD-10-CM | POA: Diagnosis not present

## 2017-02-20 DIAGNOSIS — K859 Acute pancreatitis without necrosis or infection, unspecified: Secondary | ICD-10-CM | POA: Diagnosis not present

## 2017-02-20 DIAGNOSIS — R740 Nonspecific elevation of levels of transaminase and lactic acid dehydrogenase [LDH]: Secondary | ICD-10-CM

## 2017-02-20 DIAGNOSIS — Z79899 Other long term (current) drug therapy: Secondary | ICD-10-CM

## 2017-02-20 HISTORY — DX: Acute pancreatitis without necrosis or infection, unspecified: K85.90

## 2017-02-20 LAB — COMPREHENSIVE METABOLIC PANEL
ALBUMIN: 4.1 g/dL (ref 3.5–5.0)
ALT: 295 U/L — ABNORMAL HIGH (ref 17–63)
ANION GAP: 11 (ref 5–15)
AST: 160 U/L — ABNORMAL HIGH (ref 15–41)
Alkaline Phosphatase: 142 U/L — ABNORMAL HIGH (ref 38–126)
BILIRUBIN TOTAL: 0.5 mg/dL (ref 0.3–1.2)
BUN: 9 mg/dL (ref 6–20)
CO2: 27 mmol/L (ref 22–32)
Calcium: 8.6 mg/dL — ABNORMAL LOW (ref 8.9–10.3)
Chloride: 96 mmol/L — ABNORMAL LOW (ref 101–111)
Creatinine, Ser: 1.04 mg/dL (ref 0.61–1.24)
GFR calc Af Amer: >60 mL/min (ref >60)
GFR calc non Af Amer: >60 mL/min (ref >60)
GLUCOSE: 139 mg/dL — AB (ref 65–99)
POTASSIUM: 3.8 mmol/L (ref 3.5–5.1)
SODIUM: 134 mmol/L — AB (ref 135–145)
TOTAL PROTEIN: 7.3 g/dL (ref 6.5–8.1)

## 2017-02-20 LAB — CBC
HCT: 45 % (ref 39.0–52.0)
HEMOGLOBIN: 14.3 g/dL (ref 13.0–17.0)
MCH: 28.7 pg (ref 26.0–34.0)
MCHC: 31.8 g/dL (ref 30.0–36.0)
MCV: 90.2 fL (ref 78.0–100.0)
Platelets: 268 10*3/uL (ref 150–400)
RBC: 4.99 MIL/uL (ref 4.22–5.81)
RDW: 14.1 % (ref 11.5–15.5)
WBC: 8.1 10*3/uL (ref 4.0–10.5)

## 2017-02-20 LAB — CBG MONITORING, ED: GLUCOSE-CAPILLARY: 124 mg/dL — AB (ref 65–99)

## 2017-02-20 LAB — LIPASE, BLOOD: Lipase: 106 U/L — ABNORMAL HIGH (ref 11–51)

## 2017-02-20 MED ORDER — NICOTINE 21 MG/24HR TD PT24
21.0000 mg | MEDICATED_PATCH | Freq: Every day | TRANSDERMAL | Status: DC
  Administered 2017-02-21 – 2017-02-22 (×3): 21 mg via TRANSDERMAL
  Filled 2017-02-20 (×3): qty 1

## 2017-02-20 MED ORDER — HYDROMORPHONE HCL 1 MG/ML IJ SOLN
1.0000 mg | Freq: Once | INTRAMUSCULAR | Status: AC
  Administered 2017-02-20: 1 mg via INTRAVENOUS
  Filled 2017-02-20: qty 1

## 2017-02-20 MED ORDER — ACETAMINOPHEN 325 MG PO TABS
650.0000 mg | ORAL_TABLET | Freq: Four times a day (QID) | ORAL | Status: DC | PRN
Start: 2017-02-20 — End: 2017-02-22

## 2017-02-20 MED ORDER — SENNOSIDES-DOCUSATE SODIUM 8.6-50 MG PO TABS
1.0000 | ORAL_TABLET | Freq: Every evening | ORAL | Status: DC | PRN
  Filled 2017-02-20: qty 1

## 2017-02-20 MED ORDER — LACTATED RINGERS IV SOLN
INTRAVENOUS | Status: DC
  Administered 2017-02-21 – 2017-02-22 (×4): via INTRAVENOUS

## 2017-02-20 MED ORDER — OXYCODONE-ACETAMINOPHEN 5-325 MG PO TABS
1.0000 | ORAL_TABLET | Freq: Four times a day (QID) | ORAL | Status: DC | PRN
  Administered 2017-02-21 (×2): 1 via ORAL
  Administered 2017-02-21 – 2017-02-22 (×2): 2 via ORAL
  Filled 2017-02-20: qty 1
  Filled 2017-02-20: qty 2
  Filled 2017-02-20: qty 1
  Filled 2017-02-20: qty 2

## 2017-02-20 MED ORDER — ONDANSETRON HCL 4 MG PO TABS
4.0000 mg | ORAL_TABLET | Freq: Four times a day (QID) | ORAL | Status: DC | PRN
  Administered 2017-02-21: 4 mg via ORAL
  Filled 2017-02-20: qty 1

## 2017-02-20 MED ORDER — ONDANSETRON HCL 4 MG/2ML IJ SOLN
4.0000 mg | Freq: Four times a day (QID) | INTRAMUSCULAR | Status: DC | PRN
  Administered 2017-02-22: 4 mg via INTRAVENOUS
  Filled 2017-02-20: qty 2

## 2017-02-20 MED ORDER — ACETAMINOPHEN 650 MG RE SUPP: 650.0000 mg | Freq: Four times a day (QID) | RECTAL | Status: DC | PRN

## 2017-02-20 MED ORDER — MORPHINE SULFATE (PF) 2 MG/ML IV SOLN
2.0000 mg | INTRAVENOUS | Status: DC | PRN
  Administered 2017-02-21 – 2017-02-22 (×9): 2 mg via INTRAVENOUS
  Filled 2017-02-20 (×10): qty 1

## 2017-02-20 MED ORDER — AMLODIPINE BESYLATE 5 MG PO TABS
10.0000 mg | ORAL_TABLET | Freq: Every day | ORAL | Status: DC
  Administered 2017-02-21 – 2017-02-22 (×2): 10 mg via ORAL
  Filled 2017-02-20 (×2): qty 2

## 2017-02-20 MED ORDER — METOCLOPRAMIDE HCL 5 MG/ML IJ SOLN
10.0000 mg | Freq: Once | INTRAMUSCULAR | Status: AC
  Administered 2017-02-20: 10 mg via INTRAVENOUS
  Filled 2017-02-20: qty 2

## 2017-02-20 MED ORDER — PANCRELIPASE (LIP-PROT-AMYL) 12000-38000 UNITS PO CPEP
36000.0000 [IU] | ORAL_CAPSULE | Freq: Three times a day (TID) | ORAL | Status: DC
  Administered 2017-02-21 – 2017-02-22 (×4): 36000 [IU] via ORAL
  Filled 2017-02-20 (×5): qty 1
  Filled 2017-02-20: qty 3
  Filled 2017-02-20 (×2): qty 1

## 2017-02-20 NOTE — ED Provider Notes (Signed)
[facility] EMERGENCY DEPARTMENT Provider Note   CSN: [id number] Arrival date & time: 02/20/17  1853     History   Chief Complaint Chief Complaint  Patient presents with  . Abdominal Pain    HPI Darren Adams is a 37 y.o. male.  Of epigastric pain nonradiating typical of pancreatitis onset approximate 1.5 weeks ago he is been treating himself with Percocet which does not control pain.  He also was treated with celiac plexus block approximate 1.5 weeks ago with transient relief.  He is also vomited 3 times today.  No hematemesis .nothing makes symptoms better or worse.  Denies fever denies other associated symptoms.  Pain is severe.   HPI  Past Medical History:  Diagnosis Date  . History of kidney stones   . Hypertension   . Pancreatitis   Chronic pancreatitis  There are no active problems to display for this patient.   Past Surgical History:  Procedure Laterality Date  . CHOLECYSTECTOMY    . ESOPHAGOGASTRODUODENOSCOPY    . LITHOTRIPSY    . NERVE SURGERY     Left elbow       Home Medications    Prior to Admission medications   Medication Sig Start Date End Date Taking? Authorizing Provider  HYDROcodone-acetaminophen (NORCO/VICODIN) 5-325 MG tablet 1 or 2 tabs PO q6 hours prn pain 11/20/15   Samuel JesterMcManus, Kathleen, DO  methocarbamol (ROBAXIN) 500 MG tablet Take 2 tablets (1,000 mg total) by mouth 4 (four) times daily as needed for muscle spasms (muscle spasm/pain). 11/20/15   Samuel JesterMcManus, Kathleen, DO  naproxen (NAPROSYN) 250 MG tablet Take 1 tablet (250 mg total) by mouth 2 (two) times daily as needed for mild pain or moderate pain (take with food). 11/20/15   Samuel JesterMcManus, Kathleen, DO  ondansetron (ZOFRAN) 4 MG tablet Take 1 tablet (4 mg total) by mouth every 8 (eight) hours as needed for nausea or vomiting. 11/20/15   Samuel JesterMcManus, Kathleen, DO    Family History Family History  Problem Relation Age of Onset  . Hypertension Mother     Social History Social History    Tobacco Use  . Smoking status: Current Every Day Smoker    Packs/day: 0.50  . Smokeless tobacco: Never Used  Substance Use Topics  . Alcohol use: Yes    Comment: occasional  . Drug use: No     Allergies   Penicillins   Review of Systems Review of Systems  Constitutional: Negative.   HENT: Negative.   Respiratory: Negative.   Cardiovascular: Negative.   Gastrointestinal: Positive for abdominal pain, nausea and vomiting.  Musculoskeletal: Negative.   Skin: Negative.   Neurological: Negative.   Psychiatric/Behavioral: Negative.   All other systems reviewed and are negative.    Physical Exam Updated Vital Signs BP (!) 171/111 (BP Location: Left Arm)   Pulse 75   Temp 98.4 F (36.9 C) (Oral)   Resp 20   Ht 6\' 1"  (1.854 m)   Wt 104.3 kg (230 lb)   SpO2 100%   BMI 30.34 kg/m   Physical Exam  Constitutional: He is oriented to person, place, and time. He appears well-developed and well-nourished. He appears distressed.  Appears moderately uncomfortable alert awake Glasgow Coma Score 15  HENT:  Head: Normocephalic and atraumatic.  Mucous membranes dry  Eyes: Conjunctivae are normal. Pupils are equal, round, and reactive to light.  Neck: Neck supple. No tracheal deviation present. No thyromegaly present.  Cardiovascular: Normal rate and regular rhythm.  No murmur heard. Pulmonary/Chest: Effort  normal and breath sounds normal.  Abdominal: Soft. Bowel sounds are normal. He exhibits no distension. There is tenderness.  Tender in epigastrium  Genitourinary: Penis normal.  Musculoskeletal: Normal range of motion. He exhibits no edema or tenderness.  Neurological: He is alert and oriented to person, place, and time. Coordination normal.  Skin: Skin is warm and dry. No rash noted.  Psychiatric: He has a normal mood and affect.  Nursing note and vitals reviewed.    ED Treatments / Results  Labs (all labs ordered are listed, but only abnormal results are  displayed) Labs Reviewed  LIPASE, BLOOD - Abnormal; Notable for the following components:      Result Value   Lipase 106 (*)    All other components within normal limits  COMPREHENSIVE METABOLIC PANEL - Abnormal; Notable for the following components:   Sodium 134 (*)    Chloride 96 (*)    Glucose, Bld 139 (*)    Calcium 8.6 (*)    AST 160 (*)    ALT 295 (*)    Alkaline Phosphatase 142 (*)    All other components within normal limits  CBC  URINALYSIS, ROUTINE W REFLEX MICROSCOPIC    EKG  EKG Interpretation None      Results for orders placed or performed during the hospital encounter of 02/20/17  Lipase, blood  Result Value Ref Range   Lipase 106 (H) 11 - 51 U/L  Comprehensive metabolic panel  Result Value Ref Range   Sodium 134 (L) 135 - 145 mmol/L   Potassium 3.8 3.5 - 5.1 mmol/L   Chloride 96 (L) 101 - 111 mmol/L   CO2 27 22 - 32 mmol/L   Glucose, Bld 139 (H) 65 - 99 mg/dL   BUN 9 6 - 20 mg/dL   Creatinine, Ser 1.61 0.61 - 1.24 mg/dL   Calcium 8.6 (L) 8.9 - 10.3 mg/dL   Total Protein 7.3 6.5 - 8.1 g/dL   Albumin 4.1 3.5 - 5.0 g/dL   AST 096 (H) 15 - 41 U/L   ALT 295 (H) 17 - 63 U/L   Alkaline Phosphatase 142 (H) 38 - 126 U/L   Total Bilirubin 0.5 0.3 - 1.2 mg/dL   GFR calc non Af Amer >60 >60 mL/min   GFR calc Af Amer >60 >60 mL/min   Anion gap 11 5 - 15  CBC  Result Value Ref Range   WBC 8.1 4.0 - 10.5 K/uL   RBC 4.99 4.22 - 5.81 MIL/uL   Hemoglobin 14.3 13.0 - 17.0 g/dL   HCT 04.5 40.9 - 81.1 %   MCV 90.2 78.0 - 100.0 fL   MCH 28.7 26.0 - 34.0 pg   MCHC 31.8 30.0 - 36.0 g/dL   RDW 91.4 78.2 - 95.6 %   Platelets 268 150 - 400 K/uL   No results found. Radiology No results found.  Procedures Procedures (including critical care time)  Medications Ordered in ED Medications - No data to display   Initial Impression / Assessment and Plan / ED Course  I have reviewed the triage vital signs and the nursing notes.  Pertinent labs & imaging results  that were available during my care of the patient were reviewed by me and considered in my medical decision making (see chart for details).     11 PM pain and nausea improved after treatment with intravenous hydromorphone and IV Reglan.I  Have consulted hospitalist physician Dr. Nelson Chimes who arranged for overnight stay  Final Clinical Impressions(s) /  ED Diagnoses  Diagnosis #1acute pancreatitis #2 hyperglycemia #3 elevated LFTs Final diagnoses:  None  #4 elevated blood pressure  ED Discharge Orders    None       Doug Sou, MD 02/20/17 2308

## 2017-02-20 NOTE — ED Triage Notes (Signed)
Pt c/o left upper abd pain due to pancreatitis. Pt has seen pcp for the same.

## 2017-02-20 NOTE — H&P (Signed)
History and Physical    [name] ZOX:[id number] DOB: [birth year] DOA: 02/20/2017  PCP: [facility] Patient coming from: Home  Chief Complaint: Abd pain  HPI: [name] is a 37 y.o. 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  Patient states he has been suffering from abdominal pain for past several months and has been [facility].  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  He has undergone extensive workup including EUS for this.  His abdominal pain has been intermittent since December 2018 which was at first controlled with pain medication but due to failure that he underwent celiac plexus block about 3 weeks ago.  This lasted for about 7 days but his abdominal pain since then has returned, due to this he is unable to have adequate oral intake.  More or less he has been on clear liquid diet at home.  Currently he is undergoing [facility], he has an appointment first week of March with the transplant surgeon.  In the ER today he was in significant amount of pain which was not controlled with his oral medication, his lipase was 106 with mild transaminitis.  He was admitted for poor p.o. intake, IV fluid and pain control.   Review of Systems: As per HPI otherwise 10 point review of systems negative.   Past Medical History:  Diagnosis Date  . History of kidney stones   . Hypertension   . Pancreatitis     Past Surgical History:  Procedure Laterality Date  . CHOLECYSTECTOMY    . ESOPHAGOGASTRODUODENOSCOPY    . LITHOTRIPSY    . NERVE SURGERY     Left elbow     reports that he has been smoking.  He has been smoking about 0.50 packs per day. he has never used smokeless tobacco. He reports that he drinks alcohol. He reports that he does not  use drugs.  Allergies  Allergen Reactions  . Penicillins Rash    Has patient had a PCN reaction causing immediate rash, facial/tongue/throat swelling, SOB or lightheadedness with hypotension: No Has patient had a PCN reaction causing severe rash involving mucus membranes or skin necrosis: Yes Has patient had a PCN reaction that required hospitalization No Has patient had a PCN reaction occurring within the last 10 years: No If all of the above answers are "NO", then may proceed with Cephalosporin use.     Family History  Problem Relation Age of Onset  . Hypertension Mother      Prior to Admission medications   Medication Sig Start Date End Date Taking? Authorizing Provider  amLODipine (NORVASC) 10 MG tablet Take 10 mg by mouth daily.   Yes [provider]  lipase/protease/amylase (CREON) 36000 UNITS CPEP capsule Take 1 capsule by mouth 3 (three) times daily. 07/30/16  Yes [provider]  oxyCODONE-acetaminophen (PERCOCET/ROXICET) 5-325 MG tablet Take 1-2 tablets by mouth every 6 (six) hours as needed. 02/10/17  Yes [provider]    Physical Exam: Vitals:   02/20/17 1907 02/20/17 2126  BP: (!) 182/109 (!) 171/111  Pulse: 79 75  Resp: 19 20  Temp: 98.1 F (36.7 C) 98.4 F (36.9 C)  TempSrc: Oral Oral  SpO2: 100% 100%  Weight: 104.3 kg (230 lb)   Height: 6\' 1"  (1.854 m)       Constitutional:  Mild distress due to abdominal pain Vitals:   02/20/17 1907 02/20/17 2126  BP: (!) 182/109 (!) 171/111  Pulse: 79 75  Resp: 19 20  Temp: 98.1 F (36.7 C) 98.4 F (36.9 C)  TempSrc: Oral Oral  SpO2: 100% 100%  Weight: 104.3 kg (230 lb)   Height: 6\' 1"  (1.854 m)    Eyes: PERRL, lids and conjunctivae normal ENMT: Mucous membranes are moist. Posterior pharynx clear of any exudate or lesions.Normal dentition.  Neck: normal, supple, no masses, no thyromegaly Respiratory: clear to auscultation bilaterally, no wheezing, no crackles. Normal respiratory  effort. No accessory muscle use.  Cardiovascular: Regular rate and rhythm, no murmurs / rubs / gallops. No extremity edema. 2+ pedal pulses. No carotid bruits.  Abdomen: no tenderness, no masses palpated. No hepatosplenomegaly. Bowel sounds positive.  Musculoskeletal: no clubbing / cyanosis. No joint deformity upper and lower extremities. Good ROM, no contractures. Normal muscle tone.  Skin: no rashes, lesions, ulcers. No induration Neurologic: CN 2-12 grossly intact. Sensation intact, DTR normal. Strength 5/5 in all 4.  Psychiatric: Normal judgment and insight. Alert and oriented x 3. Normal mood.     Labs on Admission: I have personally reviewed following labs and imaging studies  CBC: Recent Labs  Lab 02/20/17 1935  WBC 8.1  HGB 14.3  HCT 45.0  MCV 90.2  PLT 268   Basic Metabolic Panel: Recent Labs  Lab 02/20/17 1935  NA 134*  K 3.8  CL 96*  CO2 27  GLUCOSE 139*  BUN 9  CREATININE 1.04  CALCIUM 8.6*   GFR: Estimated Creatinine Clearance: 127 mL/min (by C-G formula based on SCr of 1.04 mg/dL). Liver Function Tests: Recent Labs  Lab 02/20/17 1935  AST 160*  ALT 295*  ALKPHOS 142*  BILITOT 0.5  PROT 7.3  ALBUMIN 4.1   Recent Labs  Lab 02/20/17 1935  LIPASE 106*   No results for input(s): AMMONIA in the last 168 hours. Coagulation Profile: No results for input(s): INR, PROTIME in the last 168 hours. Cardiac Enzymes: No results for input(s): CKTOTAL, CKMB, CKMBINDEX, TROPONINI in the last 168 hours. BNP (last 3 results) No results for input(s): PROBNP in the last 8760 hours. HbA1C: No results for input(s): HGBA1C in the last 72 hours. CBG: No results for input(s): GLUCAP in the last 168 hours. Lipid Profile: No results for input(s): CHOL, HDL, LDLCALC, TRIG, CHOLHDL, LDLDIRECT in the last 72 hours. Thyroid Function Tests: No results for input(s): TSH, T4TOTAL, FREET4, T3FREE, THYROIDAB in the last 72 hours. Anemia Panel: No results for input(s):  VITAMINB12, FOLATE, FERRITIN, TIBC, IRON, RETICCTPCT in the last 72 hours. Urine analysis:    Component Value Date/Time   COLORURINE YELLOW 11/20/2015 1803   APPEARANCEUR CLEAR 11/20/2015 1803   LABSPEC <1.005 (L) 11/20/2015 1803   PHURINE 6.5 11/20/2015 1803   GLUCOSEU NEGATIVE 11/20/2015 1803   HGBUR NEGATIVE 11/20/2015 1803   BILIRUBINUR NEGATIVE 11/20/2015 1803   KETONESUR NEGATIVE 11/20/2015 1803   PROTEINUR NEGATIVE 11/20/2015 1803   NITRITE NEGATIVE 11/20/2015 1803   LEUKOCYTESUR NEGATIVE 11/20/2015 1803   Sepsis Labs: !!!!!!!!!!!!!!!!!!!!!!!!!!!!!!!!!!!!!!!!!!!! @LABRCNTIP (procalcitonin:4,lacticidven:4) )No results found for this or any previous visit (from the past 240 hour(s)).   Radiological Exams on Admission: No results found.    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  Records have been reviewed in care everywhere -Continue IV fluids lactated Ringer, clear liquid diet, antiemetics as necessary -IV pain medication has been ordered, bowel regimen as needed -Consult gastroenterology if necessary in the morning otherwise refer him back to his outpatient GI -Continue Creon  Essential hypertension -Continue Norvasc 10 mg daily    DVT prophylaxis: Early ambulation Code Status: Full code Family Communication: None at bedside Disposition Plan: To be determined Consults called: None so far, consult GI in the morning if necessary Admission status: Observation   Jarmar Rousseau Joline Maxcy MD [facility] Pager [phone]  If 7PM-7AM, please contact night-coverage [email or web address] Password [facility]  02/20/2017, 10:44 PM

## 2017-02-21 ENCOUNTER — Observation Stay (HOSPITAL_COMMUNITY): Payer: 59

## 2017-02-21 ENCOUNTER — Other Ambulatory Visit: Payer: Self-pay

## 2017-02-21 ENCOUNTER — Encounter (HOSPITAL_COMMUNITY): Payer: Self-pay

## 2017-02-21 DIAGNOSIS — R740 Nonspecific elevation of levels of transaminase and lactic acid dehydrogenase [LDH]: Secondary | ICD-10-CM | POA: Diagnosis not present

## 2017-02-21 DIAGNOSIS — K851 Biliary acute pancreatitis without necrosis or infection: Secondary | ICD-10-CM | POA: Diagnosis not present

## 2017-02-21 DIAGNOSIS — K859 Acute pancreatitis without necrosis or infection, unspecified: Secondary | ICD-10-CM | POA: Diagnosis not present

## 2017-02-21 LAB — CBC
HCT: 42.8 % (ref 39.0–52.0)
Hemoglobin: 13.4 g/dL (ref 13.0–17.0)
MCH: 28.2 pg (ref 26.0–34.0)
MCHC: 31.3 g/dL (ref 30.0–36.0)
MCV: 89.9 fL (ref 78.0–100.0)
Platelets: 248 10*3/uL (ref 150–400)
RBC: 4.76 MIL/uL (ref 4.22–5.81)
RDW: 14.2 % (ref 11.5–15.5)
WBC: 8.9 10*3/uL (ref 4.0–10.5)

## 2017-02-21 LAB — GLUCOSE, CAPILLARY
GLUCOSE-CAPILLARY: 107 mg/dL — AB (ref 65–99)
Glucose-Capillary: 95 mg/dL (ref 65–99)

## 2017-02-21 LAB — URINALYSIS, ROUTINE W REFLEX MICROSCOPIC
Bilirubin Urine: NEGATIVE
GLUCOSE, UA: NEGATIVE mg/dL
HGB URINE DIPSTICK: NEGATIVE
Ketones, ur: NEGATIVE mg/dL
LEUKOCYTES UA: NEGATIVE
Nitrite: NEGATIVE
Protein, ur: NEGATIVE mg/dL
SPECIFIC GRAVITY, URINE: 1.021 (ref 1.005–1.030)
pH: 6 (ref 5.0–8.0)

## 2017-02-21 LAB — COMPREHENSIVE METABOLIC PANEL
ALK PHOS: 126 U/L (ref 38–126)
ALT: 258 U/L — AB (ref 17–63)
AST: 110 U/L — AB (ref 15–41)
Albumin: 3.7 g/dL (ref 3.5–5.0)
Anion gap: 10 (ref 5–15)
BILIRUBIN TOTAL: 0.7 mg/dL (ref 0.3–1.2)
BUN: 9 mg/dL (ref 6–20)
CALCIUM: 8.9 mg/dL (ref 8.9–10.3)
CO2: 27 mmol/L (ref 22–32)
Chloride: 103 mmol/L (ref 101–111)
Creatinine, Ser: 0.9 mg/dL (ref 0.61–1.24)
GFR calc Af Amer: >60 mL/min (ref >60)
GFR calc non Af Amer: >60 mL/min (ref >60)
Glucose, Bld: 104 mg/dL — ABNORMAL HIGH (ref 65–99)
Potassium: 4.5 mmol/L (ref 3.5–5.1)
Sodium: 140 mmol/L (ref 135–145)
TOTAL PROTEIN: 6.3 g/dL — AB (ref 6.5–8.1)

## 2017-02-21 LAB — LIPASE, BLOOD: Lipase: 167 U/L — ABNORMAL HIGH (ref 11–51)

## 2017-02-21 LAB — CBG MONITORING, ED: GLUCOSE-CAPILLARY: 104 mg/dL — AB (ref 65–99)

## 2017-02-21 NOTE — ED Notes (Signed)
POC CBG: 124

## 2017-02-21 NOTE — ED Notes (Signed)
Pt aware need urine, does not need to urinate at this time

## 2017-02-21 NOTE — Progress Notes (Signed)
PROGRESS NOTE                                                                                                                                                                                                             Patient Demographics:    Darren Adams, is a 37 y.o. male, DOB - [birth year], ZOX:[id number]  Admit date - 02/20/2017   Admitting Physician Ankit Joline Maxcy, MD  Outpatient Primary MD for the patient is Medicine, [facility]  LOS - 0  Outpatient Specialists: Dr Lanell Matar ( [facility])   Chief Complaint  Patient presents with  . 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  Patient having off-and-on bouts of acute pancreatitis with extensive workup including EUS.  Patient also having intermittent episodes of abdominal pain since December 2018 which failed to control with pain medications and underwent celiac plexus block about 3 weeks back.  Symptoms improved for about 7-10 days but it reoccurred.  Patient reports mainly taking clear liquid diet at home.  He is being evaluated for islet cell transplant at [facility] and has an appointment with the transplant surgeon in March. In the ED he was in significant pain which was not controlled with his home oral pain medications.  Lipase was 106 with mild transaminitis. Placed in observation for symptomatic management.      Subjective:   Reports his abdominal pain to be minimally improved since admission.  Able to tolerate some Jell-O.  Denies nausea or vomiting.   Assessment  & Plan :    Active Problems:   Acute recurrent pancreatitis Placed on observation for symptomatic management including IV hydration, pain control with morphine every 4 hours alternating with Percocet.   Supportive care with Tylenol and antiemetics.  Continue Creon. Advance diet slowly.    Acute transaminitis ALT in low 100s previously now markedly elevated (AST/ALT: 160/295).   check acute hepatitis panel. I will discuss with his gastroenterologist Dr Lanell Matar.  Essential hypertension Continue amlodipine.  Tobacco abuse On nicotine patch.  Needs counseling on cessation.        Code Status : Full code  Family Communication  : None at bedside  Disposition Plan  : Home possibly tomorrow if improved and tolerating advanced diet  Barriers For Discharge :   Consults  :  Procedures  : None  DVT Prophylaxis  :  Early ambulation  Lab Results  Component Value Date   PLT 248 02/21/2017    Antibiotics  :    Anti-infectives (From admission, onward)   None        Objective:   Vitals:   02/21/17 0700 02/21/17 0730 02/21/17 1025 02/21/17 1029  BP: 140/75 (!) 173/95 (!) 163/89 (!) 163/89  Pulse: 75 65 62   Resp:      Temp:   98.4 F (36.9 C)   TempSrc:   Oral   SpO2: 96% 97% 99%   Weight:      Height:        Wt Readings from Last 3 Encounters:  02/20/17 104.3 kg (230 lb)  11/20/15 104.3 kg (230 lb)  09/14/14 108.9 kg (240 lb)    No intake or output data in the 24 hours ending 02/21/17 1047   Physical Exam  Gen: not in distress HEENT: no pallor, moist mucosa, supple neck Chest: clear b/l, no added sounds CVS: N S1&S2, no murmurs, GI: soft, nondistended, bowel sounds present, mild epigastric tenderness to pressure Musculoskeletal: warm, no edema     Data Review:    CBC Recent Labs  Lab 02/20/17 1935 02/21/17 0533  WBC 8.1 8.9  HGB 14.3 13.4  HCT 45.0 42.8  PLT 268 248  MCV 90.2 89.9  MCH 28.7 28.2  MCHC 31.8 31.3  RDW 14.1 14.2    Chemistries  Recent Labs  Lab 02/20/17 1935 02/21/17 0533  NA 134* 140  K 3.8 4.5  CL 96* 103  CO2 27 27  GLUCOSE 139* 104*  BUN 9 9  CREATININE 1.04 0.90  CALCIUM 8.6* 8.9  AST 160* 110*  ALT 295*  258*  ALKPHOS 142* 126  BILITOT 0.5 0.7   ------------------------------------------------------------------------------------------------------------------ No results for input(s): CHOL, HDL, LDLCALC, TRIG, CHOLHDL, LDLDIRECT in the last 72 hours.  No results found for: HGBA1C ------------------------------------------------------------------------------------------------------------------ No results for input(s): TSH, T4TOTAL, T3FREE, THYROIDAB in the last 72 hours.  Invalid input(s): FREET3 ------------------------------------------------------------------------------------------------------------------ No results for input(s): VITAMINB12, FOLATE, FERRITIN, TIBC, IRON, RETICCTPCT in the last 72 hours.  Coagulation profile No results for input(s): INR, PROTIME in the last 168 hours.  No results for input(s): DDIMER in the last 72 hours.  Cardiac Enzymes No results for input(s): CKMB, TROPONINI, MYOGLOBIN in the last 168 hours.  Invalid input(s): CK ------------------------------------------------------------------------------------------------------------------ No results found for: BNP  Inpatient Medications  Scheduled Meds: . amLODipine  10 mg Oral Daily  . lipase/protease/amylase  36,000 Units Oral TID  . nicotine  21 mg Transdermal Daily   Continuous Infusions: . lactated ringers 125 mL/hr at 02/21/17 1036   PRN Meds:.acetaminophen **OR** acetaminophen, morphine injection, ondansetron **OR** ondansetron (ZOFRAN) IV, oxyCODONE-acetaminophen, senna-docusate  Micro Results No results found for this or any previous visit (from the past 240 hour(s)).  Radiology Reports No results found.  Time Spent in minutes  25   Connee Ikner M.D on 02/21/2017 at 10:47 AM  Between 7am to 7pm - Pager - [phone]  After 7pm go to [email or web address] - password [email or web address]  [facility] -  Office  [phone]

## 2017-02-22 ENCOUNTER — Observation Stay (HOSPITAL_COMMUNITY): Payer: 59

## 2017-02-22 DIAGNOSIS — K858 Other acute pancreatitis without necrosis or infection: Secondary | ICD-10-CM | POA: Diagnosis present

## 2017-02-22 DIAGNOSIS — Z765 Malingerer [conscious simulation]: Secondary | ICD-10-CM | POA: Diagnosis not present

## 2017-02-22 DIAGNOSIS — I10 Essential (primary) hypertension: Secondary | ICD-10-CM | POA: Diagnosis present

## 2017-02-22 DIAGNOSIS — Z79899 Other long term (current) drug therapy: Secondary | ICD-10-CM | POA: Diagnosis not present

## 2017-02-22 DIAGNOSIS — Z880 Allergy status to penicillin: Secondary | ICD-10-CM | POA: Diagnosis not present

## 2017-02-22 DIAGNOSIS — F17210 Nicotine dependence, cigarettes, uncomplicated: Secondary | ICD-10-CM | POA: Diagnosis present

## 2017-02-22 DIAGNOSIS — K861 Other chronic pancreatitis: Secondary | ICD-10-CM | POA: Diagnosis present

## 2017-02-22 DIAGNOSIS — K851 Biliary acute pancreatitis without necrosis or infection: Secondary | ICD-10-CM

## 2017-02-22 DIAGNOSIS — R740 Nonspecific elevation of levels of transaminase and lactic acid dehydrogenase [LDH]: Secondary | ICD-10-CM | POA: Diagnosis not present

## 2017-02-22 LAB — HEPATITIS PANEL, ACUTE
HCV Ab: <0.1 s/co ratio (ref 0.0–0.9)
HEP B C IGM: NEGATIVE
Hep A IgM: NEGATIVE
Hepatitis B Surface Ag: NEGATIVE

## 2017-02-22 LAB — COMPREHENSIVE METABOLIC PANEL
ALK PHOS: 156 U/L — AB (ref 38–126)
ALT: 367 U/L — ABNORMAL HIGH (ref 17–63)
ANION GAP: 11 (ref 5–15)
AST: 169 U/L — ABNORMAL HIGH (ref 15–41)
Albumin: 4 g/dL (ref 3.5–5.0)
BILIRUBIN TOTAL: 1.7 mg/dL — AB (ref 0.3–1.2)
BUN: 8 mg/dL (ref 6–20)
CALCIUM: 9.3 mg/dL (ref 8.9–10.3)
CO2: 24 mmol/L (ref 22–32)
Chloride: 104 mmol/L (ref 101–111)
Creatinine, Ser: 0.81 mg/dL (ref 0.61–1.24)
Glucose, Bld: 102 mg/dL — ABNORMAL HIGH (ref 65–99)
Potassium: 3.9 mmol/L (ref 3.5–5.1)
Sodium: 139 mmol/L (ref 135–145)
TOTAL PROTEIN: 6.9 g/dL (ref 6.5–8.1)

## 2017-02-22 LAB — GLUCOSE, CAPILLARY
GLUCOSE-CAPILLARY: 101 mg/dL — AB (ref 65–99)
GLUCOSE-CAPILLARY: 99 mg/dL (ref 65–99)
Glucose-Capillary: 100 mg/dL — ABNORMAL HIGH (ref 65–99)
Glucose-Capillary: 109 mg/dL — ABNORMAL HIGH (ref 65–99)

## 2017-02-22 LAB — HIV ANTIBODY (ROUTINE TESTING W REFLEX): HIV SCREEN 4TH GENERATION: NONREACTIVE

## 2017-02-22 LAB — LIPASE, BLOOD: Lipase: 278 U/L — ABNORMAL HIGH (ref 11–51)

## 2017-02-22 MED ORDER — SODIUM CHLORIDE 0.9 % IV SOLN
INTRAVENOUS | Status: DC
  Administered 2017-02-22: 09:00:00 via INTRAVENOUS

## 2017-02-22 MED ORDER — LORAZEPAM 2 MG/ML IJ SOLN
2.0000 mg | Freq: Once | INTRAMUSCULAR | Status: AC
  Administered 2017-02-22: 2 mg via INTRAVENOUS
  Filled 2017-02-22: qty 1

## 2017-02-22 NOTE — Progress Notes (Signed)
PROGRESS NOTE                                                                                                                                                                                                             Patient Demographics:    Darren Adams, is a 37 y.o. male, DOB - 1980/04/02, XLK:[id number]  Admit date - 02/20/2017   Admitting Physician Ankit Joline Maxcy, MD  Outpatient Primary MD for the patient is Medicine, [facility]  LOS - 0  Outpatient Specialists: Dr Lanell Matar ( GI at [facility])   Chief Complaint  Patient presents with  . 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  Patient having off-and-on bouts of acute pancreatitis with extensive workup including EUS.  Patient also having intermittent episodes of abdominal pain since December 2018 which failed to control with pain medications and underwent celiac plexus block about 3 weeks back.  Symptoms improved for about 7-10 days but it reoccurred.  Patient reports mainly taking clear liquid diet at home.  He is being evaluated for islet cell transplant at [facility] and has an appointment with the transplant surgeon in March. In the ED he was in significant pain which was not controlled with his home oral pain medications.  Lipase was 106 with mild transaminitis. Placed in observation for symptomatic management.      Subjective:   Still has off and on abdominal pain, no N/V. toelrating clears   Assessment  & Plan :    Active Problems:   Acute recurrent pancreatitis  symptomatic management including IV hydration, pain control with morphine every 4 hours alternating with Percocet.  Worsened lipase today ( 278) .  Supportive care with Tylenol and antiemetics.  Continue  Creon.   Acute transaminitis Worsened today with ALT of 367 and AST of 169, elevated total bili of 1.7. Patient reports having cholecystectomy for abdominal pain and ? Cholecystitis in 2006. Reports last drink was in june 2018.   Negative acute hepatitis panel. [place] abd negative for CBD obstruction. Discussed  with his gastroenterologist Dr Lanell Matar. MRCP ordered to r/o CBD stone or obstruction. Will contact him back after MRI results.  Essential hypertension  Continue amlodipine.  Tobacco abuse On nicotine patch.  counseled on cessation.        Code Status : Full code  Family Communication  : None at bedside  Disposition Plan  : pending improvement. May need to be transferred to [place] [facility] in CBD obstruction or stone seen on MRCP  Barriers For Discharge : active symptoms  Consults  :  Procedures  : None  DVT Prophylaxis  :  lovenox  Lab Results  Component Value Date   PLT 248 02/21/2017    Antibiotics  :    Anti-infectives (From admission, onward)   None        Objective:   Vitals:   02/22/17 0627 02/22/17 0825 02/22/17 1323 02/22/17 1429  BP: (!) 145/75 (!) 151/86 (!) 135/94 (!) 162/94  Pulse: 71 67 81 88  Resp: 20 20 19 20   Temp: 98 F (36.7 C) 98.6 F (37 C) 98.5 F (36.9 C) 98.7 F (37.1 C)  TempSrc: Oral Oral Oral Oral  SpO2: 98% 99% 99% 98%  Weight:      Height:        Wt Readings from Last 3 Encounters:  02/22/17 105.7 kg (233 lb 0.4 oz)  11/20/15 104.3 kg (230 lb)  09/14/14 108.9 kg (240 lb)     Intake/Output Summary (Last 24 hours) at 02/22/2017 1540 Last data filed at 02/22/2017 1610 Gross per 24 hour  Intake 1804.58 ml  Output 2500 ml  Net -695.42 ml     Physical Exam Gen: not in distress  HEENT: moist mucosa, supple neck, no icterus Chest: clear b/k no added sounds  CVS: N S1&S2, no murmurs GI: soft, ND, BS+, epigastric tenderness+ Musculoskeletal: warm , non edema      Data Review:    CBC Recent Labs   Lab 02/20/17 1935 02/21/17 0533  WBC 8.1 8.9  HGB 14.3 13.4  HCT 45.0 42.8  PLT 268 248  MCV 90.2 89.9  MCH 28.7 28.2  MCHC 31.8 31.3  RDW 14.1 14.2    Chemistries  Recent Labs  Lab 02/20/17 1935 02/21/17 0533 02/22/17 0417  NA 134* 140 139  K 3.8 4.5 3.9  CL 96* 103 104  CO2 27 27 24   GLUCOSE 139* 104* 102*  BUN 9 9 8   CREATININE 1.04 0.90 0.81  CALCIUM 8.6* 8.9 9.3  AST 160* 110* 169*  ALT 295* 258* 367*  ALKPHOS 142* 126 156*  BILITOT 0.5 0.7 1.7*   ------------------------------------------------------------------------------------------------------------------ No results for input(s): CHOL, HDL, LDLCALC, TRIG, CHOLHDL, LDLDIRECT in the last 72 hours.  No results found for: HGBA1C ------------------------------------------------------------------------------------------------------------------ No results for input(s): TSH, T4TOTAL, T3FREE, THYROIDAB in the last 72 hours.  Invalid input(s): FREET3 ------------------------------------------------------------------------------------------------------------------ No results for input(s): VITAMINB12, FOLATE, FERRITIN, TIBC, IRON, RETICCTPCT in the last 72 hours.  Coagulation profile No results for input(s): INR, PROTIME in the last 168 hours.  No results for input(s): DDIMER in the last 72 hours.  Cardiac Enzymes No results for input(s): CKMB, TROPONINI, MYOGLOBIN in the last 168 hours.  Invalid input(s): CK ------------------------------------------------------------------------------------------------------------------ No results found for: BNP  Inpatient Medications  Scheduled Meds: . amLODipine  10 mg Oral Daily  . lipase/protease/amylase  36,000 Units Oral TID  . nicotine  21 mg Transdermal Daily   Continuous Infusions: . sodium chloride 125 mL/hr at 02/22/17 0911   PRN Meds:.acetaminophen **OR** acetaminophen, morphine injection, ondansetron **OR** ondansetron (ZOFRAN) IV,  oxyCODONE-acetaminophen, senna-docusate  Micro Results No results found for this or any previous  visit (from the past 240 hour(s)).  Radiology Reports [place] Abdomen Complete  Result Date: 02/21/2017 CLINICAL DATA:  Transaminitis. Acute pancreatitis. Abdominal pain for 2 weeks. EXAM: ABDOMEN ULTRASOUND COMPLETE COMPARISON:  CT abdomen and pelvis 07/02/2016. FINDINGS: Gallbladder: Gallbladder surgically absent. Common bile duct: Diameter: Normal, 4 mm. Liver: Increased echogenicity. No focal lesion identified. Portal vein is patent on color Doppler imaging with normal direction of blood flow towards the liver. IVC: No abnormality visualized. Pancreas: Not visualized due to bowel gas. Spleen: Size and appearance within normal limits. Right Kidney: Length: Normal, 11 cm. Echogenicity within normal limits. No mass or hydronephrosis visualized. Left Kidney: Length: Normal, 11.5 cm. Echogenicity within normal limits. No mass or hydronephrosis visualized. Abdominal aorta: No aneurysm visualized. Other findings: None. IMPRESSION: Status post cholecystectomy. Hepatic steatosis. No mass, or biliary ductal dilatation. Pancreas not visualized due to bowel gas. Electronically Signed   By: Elsie StainJohn T Curnes M.D.   On: 02/21/2017 14:28    Time Spent in minutes  25   Elmar Antigua M.D on 02/22/2017 at 3:40 PM  Between 7am to 7pm - Pager - [phone]  After 7pm go to [email or web address] - password [email or web address]  [facility] -  Office  [phone]

## 2017-02-22 NOTE — Progress Notes (Addendum)
Late entry:  Patient states he "wants to be discharged.  I have a specialist appointment on Monday morning.  I talked to the doctor about it."  Dr. Gonzella Lexhungel called and notified.  Dr. Gonzella Lexhungel called RN and stated that patient cannot be discharged.  Patient notified.  Patient stated that he wanted to leave AMA.  Dr. Gonzella Lexhungel notified via text page.  Ac Educational psychologistand Charge Nurse also notified.  Patient's IV removed.  Patient alert and oriented and signed AMA papers.  Patient left unit with significant other.

## 2017-02-22 NOTE — Progress Notes (Signed)
Late entry:  Patient unable to tolerate MRI.  Dr. Gonzella Lexhungel notified.  Ativan IV ordered.  Patient still unable to tolerate MRI after Ativan.  Dr. Gonzella Lexhungel called and notified.

## 2017-02-26 DIAGNOSIS — R7401 Elevation of levels of liver transaminase levels: Secondary | ICD-10-CM | POA: Diagnosis present

## 2017-02-26 DIAGNOSIS — R740 Nonspecific elevation of levels of transaminase and lactic acid dehydrogenase [LDH]: Secondary | ICD-10-CM

## 2017-02-26 DIAGNOSIS — Z720 Tobacco use: Secondary | ICD-10-CM | POA: Diagnosis present

## 2017-02-26 NOTE — Discharge Summary (Signed)
Physician Discharge Summary  Darren AidMichael C Adams ZOX:[id number] DOB: September 30, 1980 DOA: 02/20/2017  PCP: [facility]  Admit date: 02/20/2017 Discharge date: 02/26/2017  Admitted From: Home Disposition: Home (left AGAINST MEDICAL ADVICE)  Recommendations for Outpatient Follow-up:  1. Follow up with PCP in 1-2 weeks 2. 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  Patient having off-and-on bouts of acute pancreatitis with extensive workup including EUS.  Patient also having intermittent episodes of abdominal pain since December 2018 which failed to control with pain medications and underwent celiac plexus block about 3 weeks back.  Symptoms improved for about 7-10 days but it reoccurred.  Patient reports mainly taking clear liquid diet at home.  [name] and has an appointment with the transplant surgeon in March. In the ED he was in significant pain which was not controlled with his home oral pain medications.  Lipase was 106 with mild transaminitis. Placed in observation for symptomatic management.   Active Problems:   Acute recurrent pancreatitis  -symptomatic management including IV hydration, pain control with morphine every 4 hours alternating with Percocet.  Supportive care with Tylenol and antiemetics.  Continue Creon. -Diet advance slowly.  Given progressive transaminitis MRCP ordered to rule out for choledocholithiasis or CBD obstruction.  The patient could not  tolerate MRI despite given Ativan and became extremely claustrophobic so MRI had to be aborted.  After returning to the floor from the MRI patient told the nurse that he wanted to be discharged.  The nurse paged me about this and I told her that patient cannot be discharged given his acute symptoms and that [facility] ER[facility]. Patient however still insisted that he wanted to leave AGAINST MEDICAL ADVICE.  Nurse advised him on significant health risk on leaving AMA however patient still wanted to be discharged. [facility] and left the hospital.  No prescriptions were given.  (I did not see the patient during that time since I had already left the hospital) -I had communicated with Dr. [name] on a daily basis while patient was in the hospital and he is also aware of patient leaving AMA.  Patient is being evaluated for islet cell transplant and has appointment with surgeon in March. He suggested that he had suspicion of patient having narcotic seeking behavior and possibly could be actively drinking as well.  Acute transaminitis Worsened LFTs with mildly elevated bilirubin.  Unable to perform MRCP.  Hepatitis panel negative.  [name]  Essential hypertension Continue amlodipine.  Tobacco abuse Nicotine patch was provided in the hospital and counseled on smoking cessation.        Code Status : Full code  Family Communication  : None at bedside  Disposition Plan  :  Left AMA    Consults  : None ([facility]  Procedures  : None    Discharge Instructions  Allergies as of 02/22/2017      Reactions   Penicillins Rash   Has patient had a PCN reaction causing immediate rash, facial/tongue/throat swelling, SOB or lightheadedness with hypotension: No Has patient had a PCN reaction causing severe  rash involving mucus membranes or skin necrosis: Yes Has patient had a PCN reaction that required hospitalization No Has patient had a PCN reaction occurring within the last 10 years: No If all of the above answers are "NO", then may proceed with Cephalosporin use.      Medication List    ASK your doctor about these medications   amLODipine 10 MG tablet Commonly known as:  NORVASC Take 10 mg by mouth daily.   lipase/protease/amylase 40981 UNITS Cpep capsule Commonly known as:  CREON Take 1 capsule by mouth 3 (three) times daily.   oxyCODONE-acetaminophen 5-325 MG tablet Commonly known as:  PERCOCET/ROXICET Take 1-2 tablets by mouth every 6 (six) hours as needed.      Follow-up Information    [facility] Follow up in 1 week(s).   Specialty:  Internal Medicine Contact information: [address] [place] [address] [phone]        [name], MD. Schedule an appointment as soon as possible for a visit in 2 week(s).   Specialty:  Gastroenterology Contact information: [address] [place] [address] [phone]          Allergies  Allergen Reactions  . Penicillins Rash    Has patient had a PCN reaction causing immediate rash, facial/tongue/throat swelling, SOB or lightheadedness with hypotension: No Has patient had a PCN reaction causing severe rash involving mucus membranes or skin necrosis: Yes Has patient had a PCN reaction that required hospitalization No Has patient had a PCN reaction occurring within the last 10 years: No If all of the above answers are "NO", then may proceed with Cephalosporin use.         Procedures/Studies: US Abdomen Complete  Result Date: 02/21/2017 CLINICAL DATA:  Transaminitis. Acute pancreatitis. Abdominal pain for 2 weeks. EXAM: ABDOMEN ULTRASOUND COMPLETE COMPARISON:  CT abdomen and pelvis 07/02/2016. FINDINGS: Gallbladder: Gallbladder surgically absent. Common bile duct: Diameter:  Normal, 4 mm. Liver: Increased echogenicity. No focal lesion identified. Portal vein is patent on color Doppler imaging with normal direction of blood flow towards the liver. IVC: No abnormality visualized. Pancreas: Not visualized due to bowel gas. Spleen: Size and appearance within normal limits. Right Kidney: Length: Normal, 11 cm. Echogenicity within normal limits. No mass or hydronephrosis visualized. Left Kidney: Length: Normal, 11.5 cm. Echogenicity within normal limits. No mass or hydronephrosis visualized. Abdominal aorta: No aneurysm visualized. Other findings: None. IMPRESSION: Status post cholecystectomy. Hepatic steatosis. No mass, or biliary ductal dilatation. Pancreas not visualized due to bowel gas. Electronically Signed   By: Elsie Stain M.D.   On: 02/21/2017 14:28          Discharge Exam: Vitals:   02/22/17 1323 02/22/17 1429  BP: (!) 135/94 (!) 162/94  Pulse: 81 88  Resp: 19 20  Temp: 98.5 F (36.9 C) 98.7 F (37.1 C)  SpO2: 99% 98%   Vitals:   02/22/17 0627 02/22/17 0825 02/22/17 1323 02/22/17 1429  BP: (!) 145/75 (!) 151/86 (!) 135/94 (!) 162/94  Pulse: 71 67 81 88  Resp: 20 20 19 20   Temp: 98 F (36.7 C) 98.6 F (37 C) 98.5 F (36.9 C) 98.7 F (37.1 C)  TempSrc: Oral Oral Oral Oral  SpO2: 98% 99%  99% 98%  Weight:      Height:       Physical exam from the morning of discharge Gen: not in distress  HEENT: moist mucosa, supple neck, no icterus Chest: clear b/k no added sounds  CVS: N S1&S2, no murmurs GI: soft, ND, BS+, epigastric tenderness+ Musculoskeletal: warm , non edema       The results of significant diagnostics from this hospitalization (including imaging, microbiology, ancillary and laboratory) are listed below for reference.     Microbiology: No results found for this or any previous visit (from the past 240 hour(s)).   Labs: BNP (last 3 results) No results for input(s): BNP in the last 8760 hours. Basic Metabolic  Panel: Recent Labs  Lab 02/20/17 1935 02/21/17 0533 02/22/17 0417  NA 134* 140 139  K 3.8 4.5 3.9  CL 96* 103 104  CO2 27 27 24   GLUCOSE 139* 104* 102*  BUN 9 9 8   CREATININE 1.04 0.90 0.81  CALCIUM 8.6* 8.9 9.3   Liver Function Tests: Recent Labs  Lab 02/20/17 1935 02/21/17 0533 02/22/17 0417  AST 160* 110* 169*  ALT 295* 258* 367*  ALKPHOS 142* 126 156*  BILITOT 0.5 0.7 1.7*  PROT 7.3 6.3* 6.9  ALBUMIN 4.1 3.7 4.0   Recent Labs  Lab 02/20/17 1935 02/21/17 1222 02/22/17 0417  LIPASE 106* 167* 278*   No results for input(s): AMMONIA in the last 168 hours. CBC: Recent Labs  Lab 02/20/17 1935 02/21/17 0533  WBC 8.1 8.9  HGB 14.3 13.4  HCT 45.0 42.8  MCV 90.2 89.9  PLT 268 248   Cardiac Enzymes: No results for input(s): CKTOTAL, CKMB, CKMBINDEX, TROPONINI in the last 168 hours. BNP: Invalid input(s): POCBNP CBG: Recent Labs  Lab 02/21/17 1732 02/22/17 0006 02/22/17 0636 02/22/17 1217 02/22/17 1824  GLUCAP 107* 101* 99 100* 109*   D-Dimer No results for input(s): DDIMER in the last 72 hours. Hgb A1c No results for input(s): HGBA1C in the last 72 hours. Lipid Profile No results for input(s): CHOL, HDL, LDLCALC, TRIG, CHOLHDL, LDLDIRECT in the last 72 hours. Thyroid function studies No results for input(s): TSH, T4TOTAL, T3FREE, THYROIDAB in the last 72 hours.  Invalid input(s): FREET3 Anemia work up No results for input(s): VITAMINB12, FOLATE, FERRITIN, TIBC, IRON, RETICCTPCT in the last 72 hours. Urinalysis    Component Value Date/Time   COLORURINE YELLOW 02/20/2017 0200   APPEARANCEUR CLEAR 02/20/2017 0200   LABSPEC 1.021 02/20/2017 0200   PHURINE 6.0 02/20/2017 0200   GLUCOSEU NEGATIVE 02/20/2017 0200   HGBUR NEGATIVE 02/20/2017 0200   BILIRUBINUR NEGATIVE 02/20/2017 0200   KETONESUR NEGATIVE 02/20/2017 0200   PROTEINUR NEGATIVE 02/20/2017 0200   NITRITE NEGATIVE 02/20/2017 0200   LEUKOCYTESUR NEGATIVE 02/20/2017 0200   Sepsis  Labs Invalid input(s): PROCALCITONIN,  WBC,  LACTICIDVEN Microbiology No results found for this or any previous visit (from the past 240 hour(s)).   Time coordinating discharge: < 30 minutes  SIGNED:   Eddie North, MD  [facility] 02/26/2017, 11:24 AM Pager   If 7PM-7AM, please contact night-coverage [email or web address] Password [email or web address]

## 2017-03-13 ENCOUNTER — Encounter: Admit: 2017-03-13 | Discharge: 2017-03-13 | Payer: MEDICARE

## 2017-03-13 ENCOUNTER — Ambulatory Visit: Admit: 2017-03-13 | Discharge: 2017-03-13 | Payer: PRIVATE HEALTH INSURANCE

## 2017-03-13 ENCOUNTER — Encounter: Admit: 2017-03-13 | Discharge: 2017-03-13 | Payer: PRIVATE HEALTH INSURANCE

## 2017-03-13 DIAGNOSIS — K861 Other chronic pancreatitis: Principal | ICD-10-CM

## 2017-03-13 MED ORDER — LIPASE-PROTEASE-AMYLASE 36,000-114,000-180,000 UNIT CAPSULE,DELAY REL
ORAL_CAPSULE | 11 refills | 0.00000 days | Status: SS
Start: 2017-03-13 — End: 2017-11-28

## 2017-03-13 MED ORDER — LIPASE-PROTEASE-AMYLASE 36,000-114,000-180,000 UNIT CAPSULE,DELAY REL: capsule | 11 refills | 0 days | Status: SS

## 2017-03-15 ENCOUNTER — Encounter: Admit: 2017-03-15 | Discharge: 2017-03-16 | Payer: PRIVATE HEALTH INSURANCE

## 2017-03-15 DIAGNOSIS — J324 Chronic pansinusitis: Principal | ICD-10-CM

## 2017-04-18 MED ORDER — ONDANSETRON HCL 4 MG TABLET
ORAL_TABLET | Freq: Three times a day (TID) | ORAL | 0 refills | 0.00000 days | Status: CP | PRN
Start: 2017-04-18 — End: 2018-04-25

## 2017-07-17 ENCOUNTER — Ambulatory Visit: Admit: 2017-07-17 | Discharge: 2017-07-17 | Payer: PRIVATE HEALTH INSURANCE

## 2017-07-17 ENCOUNTER — Encounter: Admit: 2017-07-17 | Discharge: 2017-07-17 | Payer: PRIVATE HEALTH INSURANCE

## 2017-07-17 ENCOUNTER — Institutional Professional Consult (permissible substitution): Admit: 2017-07-17 | Discharge: 2017-07-17 | Payer: PRIVATE HEALTH INSURANCE

## 2017-07-17 DIAGNOSIS — K859 Acute pancreatitis without necrosis or infection, unspecified: Principal | ICD-10-CM

## 2017-07-17 MED ORDER — LIPASE-PROTEASE-AMYLASE 36,000-114,000-180,000 UNIT CAPSULE,DELAY REL
ORAL_CAPSULE | Freq: Three times a day (TID) | ORAL | 0 refills | 0 days | Status: SS
Start: 2017-07-17 — End: 2017-11-28

## 2017-08-27 ENCOUNTER — Encounter
Admit: 2017-08-27 | Discharge: 2017-08-28 | Payer: PRIVATE HEALTH INSURANCE | Attending: Student in an Organized Health Care Education/Training Program | Primary: Student in an Organized Health Care Education/Training Program

## 2017-08-27 ENCOUNTER — Non-Acute Institutional Stay
Admit: 2017-08-27 | Discharge: 2017-08-28 | Payer: PRIVATE HEALTH INSURANCE | Attending: Registered" | Primary: Registered"

## 2017-08-27 DIAGNOSIS — K8500 Idiopathic acute pancreatitis without necrosis or infection: Secondary | ICD-10-CM

## 2017-08-27 DIAGNOSIS — K861 Other chronic pancreatitis: Principal | ICD-10-CM

## 2017-09-10 ENCOUNTER — Encounter: Admit: 2017-09-10 | Discharge: 2017-09-11 | Payer: PRIVATE HEALTH INSURANCE

## 2017-09-11 ENCOUNTER — Encounter: Admit: 2017-09-11 | Discharge: 2017-09-12 | Payer: PRIVATE HEALTH INSURANCE

## 2017-09-11 DIAGNOSIS — K859 Acute pancreatitis without necrosis or infection, unspecified: Principal | ICD-10-CM

## 2017-10-09 ENCOUNTER — Ambulatory Visit: Admit: 2017-10-09 | Discharge: 2017-10-10 | Payer: PRIVATE HEALTH INSURANCE

## 2017-10-09 ENCOUNTER — Encounter: Admit: 2017-10-09 | Discharge: 2017-10-10 | Payer: PRIVATE HEALTH INSURANCE

## 2017-10-09 DIAGNOSIS — K859 Acute pancreatitis without necrosis or infection, unspecified: Principal | ICD-10-CM

## 2017-10-09 DIAGNOSIS — Z01810 Encounter for preprocedural cardiovascular examination: Secondary | ICD-10-CM

## 2017-10-09 DIAGNOSIS — K861 Other chronic pancreatitis: Principal | ICD-10-CM

## 2017-10-09 MED ORDER — PEG 3350-ELECTROLYTES 236 GRAM-22.74 GRAM-6.74 GRAM-5.86 GRAM SOLUTION
Freq: Once | ORAL | 0 refills | 0.00000 days | Status: CP
Start: 2017-10-09 — End: 2017-10-15

## 2017-10-11 DIAGNOSIS — K861 Other chronic pancreatitis: Principal | ICD-10-CM

## 2017-10-15 ENCOUNTER — Encounter: Admit: 2017-10-15 | Discharge: 2017-10-15 | Payer: PRIVATE HEALTH INSURANCE

## 2017-10-15 DIAGNOSIS — K861 Other chronic pancreatitis: Principal | ICD-10-CM

## 2017-10-30 ENCOUNTER — Encounter: Admit: 2017-10-30 | Discharge: 2017-10-31 | Payer: PRIVATE HEALTH INSURANCE

## 2017-10-30 DIAGNOSIS — K859 Acute pancreatitis without necrosis or infection, unspecified: Secondary | ICD-10-CM

## 2017-10-30 DIAGNOSIS — K861 Other chronic pancreatitis: Principal | ICD-10-CM

## 2017-11-20 ENCOUNTER — Encounter: Admit: 2017-11-20 | Discharge: 2017-11-21 | Payer: PRIVATE HEALTH INSURANCE

## 2017-11-20 DIAGNOSIS — K861 Other chronic pancreatitis: Principal | ICD-10-CM

## 2017-11-20 DIAGNOSIS — K851 Biliary acute pancreatitis without necrosis or infection: Principal | ICD-10-CM

## 2017-11-25 ENCOUNTER — Ambulatory Visit
Admit: 2017-11-25 | Discharge: 2017-12-04 | Disposition: A | Discharge status: Home / Self Care | Payer: PRIVATE HEALTH INSURANCE

## 2017-11-25 ENCOUNTER — Encounter
Admit: 2017-11-25 | Discharge: 2017-12-04 | Disposition: A | Discharge status: Home / Self Care | Payer: PRIVATE HEALTH INSURANCE

## 2017-11-26 DIAGNOSIS — K861 Other chronic pancreatitis: Principal | ICD-10-CM

## 2017-11-27 DIAGNOSIS — K861 Other chronic pancreatitis: Principal | ICD-10-CM

## 2017-11-28 DIAGNOSIS — K861 Other chronic pancreatitis: Principal | ICD-10-CM

## 2017-11-29 DIAGNOSIS — K861 Other chronic pancreatitis: Principal | ICD-10-CM

## 2017-12-01 DIAGNOSIS — K861 Other chronic pancreatitis: Principal | ICD-10-CM

## 2017-12-04 MED ORDER — CYCLOBENZAPRINE 5 MG TABLET
ORAL_TABLET | Freq: Three times a day (TID) | ORAL | 0 refills | 0 days | Status: CP | PRN
Start: 2017-12-04 — End: 2017-12-18
  Filled 2017-12-04: qty 60, 20d supply, fill #0

## 2017-12-04 MED ORDER — CHLORTHALIDONE 25 MG TABLET
ORAL_TABLET | Freq: Every day | ORAL | 11 refills | 0 days | Status: CP
Start: 2017-12-04 — End: 2017-12-10
  Filled 2017-12-04: qty 15, 30d supply, fill #0

## 2017-12-04 MED ORDER — ASPIRIN 81 MG CHEWABLE TABLET
ORAL_TABLET | Freq: Every day | ORAL | 11 refills | 0 days | Status: CP
Start: 2017-12-04 — End: 2018-12-04

## 2017-12-04 MED ORDER — CLONIDINE HCL 0.2 MG TABLET
ORAL_TABLET | Freq: Three times a day (TID) | ORAL | 0 refills | 0.00000 days | Status: CP
Start: 2017-12-04 — End: 2018-01-13
  Filled 2017-12-04: qty 21, 14d supply, fill #0

## 2017-12-04 MED ORDER — ACETAMINOPHEN 325 MG TABLET
ORAL_TABLET | Freq: Four times a day (QID) | ORAL | 1 refills | 0 days | Status: CP
Start: 2017-12-04 — End: ?
  Filled 2017-12-04: qty 60, 8d supply, fill #0
  Filled 2017-12-04: qty 36, 30d supply, fill #0

## 2017-12-04 MED ORDER — PANTOPRAZOLE 40 MG TABLET,DELAYED RELEASE
ORAL_TABLET | Freq: Two times a day (BID) | ORAL | 3 refills | 0.00000 days | Status: CP
Start: 2017-12-04 — End: 2018-05-22
  Filled 2017-12-04: qty 60, 30d supply, fill #0

## 2017-12-04 MED ORDER — GABAPENTIN 300 MG CAPSULE
ORAL_CAPSULE | Freq: Three times a day (TID) | ORAL | 3 refills | 0.00000 days | Status: CP
Start: 2017-12-04 — End: 2018-12-04
  Filled 2017-12-04: qty 180, 30d supply, fill #0

## 2017-12-04 MED ORDER — HYDROMORPHONE 4 MG TABLET
ORAL_TABLET | ORAL | 0 refills | 0 days | Status: CP | PRN
Start: 2017-12-04 — End: 2017-12-10
  Filled 2017-12-04: qty 40, 7d supply, fill #0

## 2017-12-04 MED ORDER — METOCLOPRAMIDE 10 MG TABLET
ORAL_TABLET | Freq: Four times a day (QID) | ORAL | 3 refills | 0 days | Status: CP
Start: 2017-12-04 — End: 2018-01-07
  Filled 2017-12-04: qty 60, 15d supply, fill #0

## 2017-12-04 MED ORDER — MELATONIN 3 MG TABLET
ORAL_TABLET | Freq: Every evening | ORAL | 0 refills | 0 days | Status: CP
Start: 2017-12-04 — End: ?
  Filled 2017-12-04: qty 100, 50d supply, fill #0

## 2017-12-04 MED ORDER — CARVEDILOL 25 MG TABLET
ORAL_TABLET | Freq: Two times a day (BID) | ORAL | 3 refills | 0 days | Status: CP
Start: 2017-12-04 — End: 2018-12-04
  Filled 2017-12-04: qty 60, 30d supply, fill #0

## 2017-12-04 MED ORDER — ONDANSETRON 4 MG DISINTEGRATING TABLET
ORAL_TABLET | Freq: Four times a day (QID) | ORAL | 2 refills | 0 days | Status: CP | PRN
Start: 2017-12-04 — End: 2017-12-11
  Filled 2017-12-04: qty 9, 3d supply, fill #0

## 2017-12-04 MED ORDER — ENOXAPARIN 60 MG/0.6 ML SUBCUTANEOUS SYRINGE
Freq: Two times a day (BID) | SUBCUTANEOUS | 0 refills | 0.00000 days | Status: CP
Start: 2017-12-04 — End: 2017-12-18
  Filled 2017-12-04: qty 36, 30d supply, fill #0

## 2017-12-04 MED ORDER — MORPHINE ER 30 MG TABLET,EXTENDED RELEASE
ORAL_TABLET | Freq: Two times a day (BID) | ORAL | 0 refills | 0 days | Status: CP
Start: 2017-12-04 — End: 2017-12-09
  Filled 2017-12-04: qty 10, 5d supply, fill #0

## 2017-12-04 MED ORDER — PROMETHAZINE 12.5 MG TABLET
ORAL_TABLET | Freq: Four times a day (QID) | ORAL | 0 refills | 0.00000 days | Status: CP | PRN
Start: 2017-12-04 — End: 2018-04-25
  Filled 2017-12-04: qty 60, 15d supply, fill #0

## 2017-12-04 MED ORDER — GLUCAGON (HUMAN RECOMBINANT) 1 MG SOLUTION FOR INJECTION
Freq: Once | INTRAMUSCULAR | 2 refills | 0.00000 days | Status: CP
Start: 2017-12-04 — End: 2017-12-06
  Filled 2017-12-04: qty 2, 2d supply, fill #0

## 2017-12-04 MED ORDER — LIPASE-PROTEASE-AMYLASE 36,000-114,000-180,000 UNIT CAPSULE,DELAY REL
ORAL_CAPSULE | 2 refills | 0 days | Status: CP
Start: 2017-12-04 — End: 2017-12-18

## 2017-12-04 MED ORDER — AMLODIPINE 10 MG TABLET
ORAL_TABLET | Freq: Every day | ORAL | 3 refills | 0 days | Status: CP
Start: 2017-12-04 — End: 2017-12-18
  Filled 2017-12-04: qty 30, 30d supply, fill #0

## 2017-12-04 MED FILL — MORPHINE ER 30 MG TABLET,EXTENDED RELEASE: 5 days supply | Qty: 10 | Fill #0 | Status: AC

## 2017-12-04 MED FILL — CYCLOBENZAPRINE 5 MG TABLET: 20 days supply | Qty: 60 | Fill #0 | Status: AC

## 2017-12-04 MED FILL — CHLORTHALIDONE 25 MG TABLET: 30 days supply | Qty: 15 | Fill #0 | Status: AC

## 2017-12-04 MED FILL — MELATONIN 3 MG TABLET: 50 days supply | Qty: 100 | Fill #0 | Status: AC

## 2017-12-04 MED FILL — ASPIRIN 81 MG CHEWABLE TABLET: 30 days supply | Qty: 36 | Fill #0 | Status: AC

## 2017-12-04 MED FILL — HYDROMORPHONE 4 MG TABLET: 7 days supply | Qty: 40 | Fill #0 | Status: AC

## 2017-12-04 MED FILL — PANTOPRAZOLE 40 MG TABLET,DELAYED RELEASE: 30 days supply | Qty: 60 | Fill #0 | Status: AC

## 2017-12-04 MED FILL — AMLODIPINE 10 MG TABLET: 30 days supply | Qty: 30 | Fill #0 | Status: AC

## 2017-12-04 MED FILL — GABAPENTIN 300 MG CAPSULE: 30 days supply | Qty: 180 | Fill #0 | Status: AC

## 2017-12-04 MED FILL — GLUCAGON EMERGENCY KIT (HUMAN-RECOMB) 1 MG SOLUTION FOR INJECTION: 2 days supply | Qty: 2 | Fill #0 | Status: AC

## 2017-12-04 MED FILL — CLONIDINE HCL 0.2 MG TABLET: 14 days supply | Qty: 21 | Fill #0 | Status: AC

## 2017-12-04 MED FILL — METOCLOPRAMIDE 10 MG TABLET: 15 days supply | Qty: 60 | Fill #0 | Status: AC

## 2017-12-04 MED FILL — ONDANSETRON 4 MG DISINTEGRATING TABLET: 3 days supply | Qty: 9 | Fill #0 | Status: AC

## 2017-12-04 MED FILL — ENOXAPARIN 60 MG/0.6 ML SUBCUTANEOUS SYRINGE: 30 days supply | Qty: 36 | Fill #0 | Status: AC

## 2017-12-04 MED FILL — CARVEDILOL 25 MG TABLET: 30 days supply | Qty: 60 | Fill #0 | Status: AC

## 2017-12-04 MED FILL — PROMETHAZINE 12.5 MG TABLET: 15 days supply | Qty: 60 | Fill #0 | Status: AC

## 2017-12-04 MED FILL — ACETAMINOPHEN 325 MG TABLET: 8 days supply | Qty: 60 | Fill #0 | Status: AC

## 2017-12-09 ENCOUNTER — Encounter: Admit: 2017-12-09 | Discharge: 2017-12-10 | Payer: PRIVATE HEALTH INSURANCE

## 2017-12-09 DIAGNOSIS — Z9081 Acquired absence of spleen: Principal | ICD-10-CM

## 2017-12-09 MED ORDER — MORPHINE ER 30 MG TABLET,EXTENDED RELEASE
ORAL_TABLET | Freq: Two times a day (BID) | ORAL | 0 refills | 0 days | Status: CP
Start: 2017-12-09 — End: ?

## 2017-12-10 ENCOUNTER — Encounter: Admit: 2017-12-10 | Discharge: 2017-12-11 | Payer: PRIVATE HEALTH INSURANCE

## 2017-12-10 DIAGNOSIS — Z9081 Acquired absence of spleen: Principal | ICD-10-CM

## 2017-12-10 MED ORDER — HYDROMORPHONE 4 MG TABLET
ORAL_TABLET | ORAL | 0 refills | 0 days | Status: CP | PRN
Start: 2017-12-10 — End: 2017-12-20

## 2017-12-10 MED ORDER — MORPHINE ER 15 MG TABLET,EXTENDED RELEASE
ORAL_TABLET | 0 refills | 0 days | Status: CP
Start: 2017-12-10 — End: 2018-01-13

## 2017-12-11 MED ORDER — FLASH GLUCOSE SENSOR KIT
11 refills | 0.00000 days | Status: CP
Start: 2017-12-11 — End: 2018-01-10

## 2017-12-12 ENCOUNTER — Encounter: Admit: 2017-12-12 | Discharge: 2017-12-13 | Payer: PRIVATE HEALTH INSURANCE

## 2017-12-12 DIAGNOSIS — Z9081 Acquired absence of spleen: Principal | ICD-10-CM

## 2017-12-18 ENCOUNTER — Encounter: Admit: 2017-12-18 | Discharge: 2017-12-18 | Payer: PRIVATE HEALTH INSURANCE

## 2017-12-18 ENCOUNTER — Encounter
Admit: 2017-12-18 | Discharge: 2017-12-18 | Payer: PRIVATE HEALTH INSURANCE | Attending: Nutritionist | Primary: Nutritionist

## 2017-12-18 DIAGNOSIS — Z9483 Pancreas transplant status: Principal | ICD-10-CM

## 2017-12-18 DIAGNOSIS — K860 Alcohol-induced chronic pancreatitis: Secondary | ICD-10-CM

## 2017-12-18 DIAGNOSIS — K861 Other chronic pancreatitis: Secondary | ICD-10-CM

## 2017-12-18 DIAGNOSIS — Z9081 Acquired absence of spleen: Secondary | ICD-10-CM

## 2017-12-18 MED ORDER — CYCLOBENZAPRINE 5 MG TABLET
ORAL_TABLET | Freq: Three times a day (TID) | ORAL | 0 refills | 0.00000 days | Status: CP | PRN
Start: 2017-12-18 — End: 2018-01-23

## 2017-12-18 MED ORDER — ENOXAPARIN 60 MG/0.6 ML SUBCUTANEOUS SYRINGE
Freq: Every day | SUBCUTANEOUS | 0 refills | 0 days
Start: 2017-12-18 — End: 2018-01-03

## 2017-12-18 MED ORDER — LIPASE-PROTEASE-AMYLASE 36,000-114,000-180,000 UNIT CAPSULE,DELAY REL
ORAL_CAPSULE | 2 refills | 0 days | Status: CP
Start: 2017-12-18 — End: 2018-01-10

## 2018-01-02 ENCOUNTER — Non-Acute Institutional Stay: Admit: 2018-01-02 | Discharge: 2018-01-03 | Payer: PRIVATE HEALTH INSURANCE

## 2018-01-02 ENCOUNTER — Encounter: Admit: 2018-01-02 | Discharge: 2018-01-03 | Payer: PRIVATE HEALTH INSURANCE

## 2018-01-02 DIAGNOSIS — K5900 Constipation, unspecified: Principal | ICD-10-CM

## 2018-01-03 DIAGNOSIS — K5900 Constipation, unspecified: Principal | ICD-10-CM

## 2018-01-03 MED ORDER — FLASH GLUCOSE SENSOR KIT
11 refills | 0.00000 days | Status: CP
Start: 2018-01-03 — End: 2019-01-03

## 2018-01-07 MED ORDER — METOCLOPRAMIDE 10 MG TABLET
ORAL_TABLET | Freq: Four times a day (QID) | ORAL | 6 refills | 0 days | Status: CP
Start: 2018-01-07 — End: 2018-02-06

## 2018-01-10 MED ORDER — LIPASE-PROTEASE-AMYLASE 36,000-114,000-180,000 UNIT CAPSULE,DELAY REL
ORAL_CAPSULE | 11 refills | 0 days | Status: CP
Start: 2018-01-10 — End: 2018-04-16

## 2018-01-13 ENCOUNTER — Encounter: Admit: 2018-01-13 | Discharge: 2018-01-13 | Payer: MEDICARE

## 2018-01-13 ENCOUNTER — Encounter
Admit: 2018-01-13 | Discharge: 2018-01-13 | Payer: PRIVATE HEALTH INSURANCE | Attending: Pharmacist Clinician (PhC)/ Clinical Pharmacy Specialist | Primary: Pharmacist Clinician (PhC)/ Clinical Pharmacy Specialist

## 2018-01-13 ENCOUNTER — Ambulatory Visit: Admit: 2018-01-13 | Discharge: 2018-01-13 | Payer: PRIVATE HEALTH INSURANCE

## 2018-01-13 DIAGNOSIS — Z90410 Acquired total absence of pancreas: Principal | ICD-10-CM

## 2018-01-13 DIAGNOSIS — Z9483 Pancreas transplant status: Secondary | ICD-10-CM

## 2018-01-13 DIAGNOSIS — Z9889 Other specified postprocedural states: Principal | ICD-10-CM

## 2018-01-13 MED ORDER — METFORMIN ER 500 MG TABLET,EXTENDED RELEASE 24 HR
ORAL_TABLET | Freq: Every day | ORAL | 3 refills | 0 days | Status: CP
Start: 2018-01-13 — End: 2018-01-13

## 2018-01-13 MED ORDER — METFORMIN ER 500 MG TABLET,EXTENDED RELEASE 24 HR: 500 mg | tablet | Freq: Every day | 3 refills | 0 days | Status: AC

## 2018-01-13 MED ORDER — BLOOD SUGAR DIAGNOSTIC STRIPS
ORAL_STRIP | 3 refills | 0.00000 days | Status: CP
Start: 2018-01-13 — End: 2018-01-13

## 2018-01-13 MED ORDER — BLOOD SUGAR DIAGNOSTIC STRIPS: strip | 3 refills | 0 days | Status: AC

## 2018-01-15 ENCOUNTER — Encounter: Admit: 2018-01-15 | Discharge: 2018-01-16 | Payer: PRIVATE HEALTH INSURANCE

## 2018-01-15 DIAGNOSIS — Z90410 Acquired total absence of pancreas: Principal | ICD-10-CM

## 2018-01-15 MED ORDER — POLYETHYLENE GLYCOL 3350 17 GRAM/DOSE ORAL POWDER
Freq: Two times a day (BID) | ORAL | 11 refills | 0.00000 days | Status: CP
Start: 2018-01-15 — End: ?

## 2018-01-15 MED ORDER — SUCRALFATE 1 GRAM TABLET
ORAL_TABLET | Freq: Four times a day (QID) | ORAL | 3 refills | 0 days | Status: CP
Start: 2018-01-15 — End: 2018-02-04

## 2018-01-23 MED ORDER — CYCLOBENZAPRINE 5 MG TABLET
ORAL_TABLET | Freq: Three times a day (TID) | ORAL | 0 refills | 0 days | Status: CP | PRN
Start: 2018-01-23 — End: 2018-03-03

## 2018-02-04 MED ORDER — SUCRALFATE 1 GRAM TABLET
ORAL_TABLET | Freq: Four times a day (QID) | ORAL | 3 refills | 0 days | Status: CP
Start: 2018-02-04 — End: 2019-02-04

## 2018-02-19 ENCOUNTER — Encounter: Admit: 2018-02-19 | Discharge: 2018-02-20 | Payer: PRIVATE HEALTH INSURANCE

## 2018-02-19 DIAGNOSIS — K644 Residual hemorrhoidal skin tags: Secondary | ICD-10-CM

## 2018-02-19 DIAGNOSIS — K648 Other hemorrhoids: Secondary | ICD-10-CM

## 2018-03-03 MED ORDER — CYCLOBENZAPRINE 5 MG TABLET
ORAL_TABLET | Freq: Three times a day (TID) | ORAL | 0 refills | 0 days | Status: CP | PRN
Start: 2018-03-03 — End: 2018-04-02

## 2018-03-05 ENCOUNTER — Encounter: Admit: 2018-03-05 | Discharge: 2018-03-06 | Payer: PRIVATE HEALTH INSURANCE

## 2018-03-05 DIAGNOSIS — Z9489 Other transplanted organ and tissue status: Principal | ICD-10-CM

## 2018-03-11 ENCOUNTER — Encounter
Admit: 2018-03-11 | Discharge: 2018-03-11 | Payer: PRIVATE HEALTH INSURANCE | Attending: Certified Registered" | Primary: Certified Registered"

## 2018-03-11 ENCOUNTER — Encounter: Admit: 2018-03-11 | Discharge: 2018-03-11 | Payer: PRIVATE HEALTH INSURANCE

## 2018-03-11 DIAGNOSIS — Z980 Intestinal bypass and anastomosis status: Principal | ICD-10-CM

## 2018-03-11 DIAGNOSIS — I10 Essential (primary) hypertension: Principal | ICD-10-CM

## 2018-03-11 DIAGNOSIS — Z9049 Acquired absence of other specified parts of digestive tract: Principal | ICD-10-CM

## 2018-03-11 DIAGNOSIS — Z880 Allergy status to penicillin: Principal | ICD-10-CM

## 2018-03-11 DIAGNOSIS — K859 Acute pancreatitis without necrosis or infection, unspecified: Principal | ICD-10-CM

## 2018-03-11 DIAGNOSIS — K219 Gastro-esophageal reflux disease without esophagitis: Principal | ICD-10-CM

## 2018-03-11 DIAGNOSIS — N200 Calculus of kidney: Principal | ICD-10-CM

## 2018-03-11 DIAGNOSIS — R6881 Early satiety: Principal | ICD-10-CM

## 2018-03-13 DIAGNOSIS — I10 Essential (primary) hypertension: Principal | ICD-10-CM

## 2018-03-13 DIAGNOSIS — K859 Acute pancreatitis without necrosis or infection, unspecified: Principal | ICD-10-CM

## 2018-03-13 DIAGNOSIS — K219 Gastro-esophageal reflux disease without esophagitis: Principal | ICD-10-CM

## 2018-03-13 DIAGNOSIS — N200 Calculus of kidney: Principal | ICD-10-CM

## 2018-04-16 MED ORDER — LIPASE-PROTEASE-AMYLASE 36,000-114,000-180,000 UNIT CAPSULE,DELAY REL
ORAL_CAPSULE | 11 refills | 0 days | Status: CP
Start: 2018-04-16 — End: 2018-05-28

## 2018-04-25 MED ORDER — PROMETHAZINE 12.5 MG TABLET
ORAL_TABLET | Freq: Every evening | ORAL | 0 refills | 0 days | Status: CP
Start: 2018-04-25 — End: 2018-05-25

## 2018-04-25 MED ORDER — ONDANSETRON HCL 4 MG TABLET
ORAL_TABLET | Freq: Three times a day (TID) | ORAL | 0 refills | 0.00000 days | Status: CP | PRN
Start: 2018-04-25 — End: 2018-05-25

## 2018-05-22 MED ORDER — PANTOPRAZOLE 40 MG TABLET,DELAYED RELEASE
ORAL_TABLET | Freq: Two times a day (BID) | ORAL | 11 refills | 0.00000 days | Status: CP
Start: 2018-05-22 — End: 2018-05-28

## 2018-05-28 MED ORDER — LIPASE-PROTEASE-AMYLASE 36,000-114,000-180,000 UNIT CAPSULE,DELAY REL
ORAL_CAPSULE | 11 refills | 0 days | Status: CP
Start: 2018-05-28 — End: 2019-05-27
  Filled 2018-06-06: qty 420, 30d supply, fill #0

## 2018-05-28 MED ORDER — PANTOPRAZOLE 40 MG TABLET,DELAYED RELEASE
ORAL_TABLET | Freq: Two times a day (BID) | ORAL | 11 refills | 0 days | Status: CP
Start: 2018-05-28 — End: 2018-06-06

## 2018-05-28 NOTE — Unmapped (Signed)
ENrolled in PAP. PAtient has application in hand. Scripts for [employer] and Creon sent to [facility]. Patient provided with phone #

## 2018-05-28 NOTE — Unmapped (Signed)
[facility]   Patient Onboarding/Medication Counseling    Mr.Cua is a 38 y.o. 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  I am speaking to the patient.    Verified patient's date of birth / HIPAA.    Specialty medication(s) to be sent: Transplant: Creon 36,000-114,000-180,000 units      Non-specialty medications/supplies to be sent: none      Medications not needed at this time: none         CREON Pancreatic Enzyme  Used for the treatment of pancreatic insufficiency to help breakdown fats, proteins, and starch.    Medication & Administration     Confirmed the medication and dosage: Creon 36000-114000-180000 units: 3 capsules with meals and 2 capsules with snacks    Administration:   ? Must always be taken with food  ? For Infants up to 12 months:  o Contents of capsule should not be mixed pancrelipase directly into formula or breast milk prior to administration.  o Contents of capsule may be sprinkled on small amount (~10 mL)of applesauce and given to the infant within 15 minutes.  o Contents of capsule may also be administered directly to the mouth.   ? For children that cannot swallow intact capsules:  o Contents of capsule may be sprinkled on small amount of applesauce and give within 15 minutes. Then follow with water or juice to ensure complete ingestion.    Missed dose instruction:   ? If you miss a dose of pancrelipase:  o The next dose should be taken with the next meal or snack as directed  o Do Not take two doses at one time.      Instructions for storage:   ? Store at room temperature, avoid moisture    Common Side Effects     ? Diarrhea, vomiting  ? Abdominal pain, dyspepsia, flatulence  ? Dizziness  ? Fatigue  ? Headache    Warning and Precautions     Potential for Irritation to Oral Mucosa: Ensure that no drug is retained in the mouth. No capsule should be crushed or chewed. Only can be sprinkled and mixed in applesauce; followed by juice or water to make sure all ingested and out of mouth.     Fibrosing colonopathy: Rare, serious adverse reaction with high-dose pancreatic enzyme use, usually over a prolonged period of time. Follow the prescribed dosing, do not change dosing without the team???s consent. Doses exceeding 6,000 lipase units/kg of body weight per meal have been associated with this rare adverse reaction.       Drug interactions: no interactions noted that clinic is not already monitoring      Current Medications (including OTC/herbals), Comorbidities and Allergies     Current Outpatient Medications   Medication Sig Dispense Refill   ??? acetaminophen (TYLENOL) 325 MG tablet Take 2 tablets (650 mg total) by mouth Every six (6) hours. (Patient not taking: Reported on 02/19/2018) 60 tablet 1   ??? aspirin 81 MG chewable tablet Chew 1 tablet (81 mg total) daily. 30 tablet 11   ??? blood sugar diagnostic Strp Dispense 100 blood glucose test strips, ok to sub any brand preferred by insurance/patient, use 1x/day 100 strip 3   ??? carvedilol (COREG) 25 MG tablet Take 1 tablet (25 mg total) by mouth Two (2) times a day. (Patient not taking: Reported on 02/19/2018) 180 tablet 3   ??? flash glucose sensor (FREESTYLE LIBRE 14 DAY SENSOR) kit by  Other route every fourteen (14) days. (Patient not taking: Reported on 02/19/2018) 2 each 11   ??? gabapentin (NEURONTIN) 300 MG capsule Take 2 capsules (600 mg total) by mouth Three (3) times a day. (Patient not taking: Reported on 02/19/2018) 270 capsule 3   ??? lipase-protease-amylase (CREON) 36,000-114,000- 180,000 unit CpDR Take 3 capsules by mouth with meals and 2 capsules by mouth with snacks as directed 420 capsule 11   ??? melatonin 3 mg Tab Take 2 tablets (6 mg total) by mouth every evening. 60 tablet 0   ??? MORPhine (MS CONTIN) 30 MG 12 hr tablet Take 1 tablet (30 mg total) by mouth Two (2) times a day. (Patient not taking: Reported on 02/19/2018) 10 tablet 0   ??? pantoprazole (PROTONIX) 40 MG tablet Take 1 tablet (40 mg total) by mouth Two (2) times a day. 60 tablet 11   ??? polyethylene glycol (MIRALAX) 17 gram/dose powder Take 17 g by mouth Two (2) times a day. Generic OK 255 g 11   ??? sucralfate (CARAFATE) 1 gram tablet Take 1 tablet (1 g total) by mouth Four (4) times a day. (Patient not taking: Reported on 02/19/2018) 360 tablet 3     No current facility-administered medications for this visit.        Allergies   Allergen Reactions   ??? Penicillin Rash       Patient Active Problem List   Diagnosis   ??? Chronic pancreatitis   ??? Difficult airway for intubation   ??? 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 Internal and external hemorrhoids without complication       Reviewed and up to date in Epic.    Appropriateness of Therapy     Is medication and dose appropriate based on diagnosis? Yes    Baseline Quality of Life Assessment      How many days over the past month did your transplant keep you from your normal activities? 0    Financial Information     Medication Assistance provided: None Required    Anticipated copay of $0 reviewed with patient. Verified delivery address.    Delivery Information     Scheduled delivery date: 06/09/2018    Expected start date: 06/09/2018    Medication will be delivered via UPS to the home address in [facility].  This shipment will not require a signature.      Explained the services we provide at [facility] and that each month we would call to set up refills.  Stressed importance of returning phone calls so that we could ensure they receive their medications in time each month.  Informed patient that we should be setting up refills 7-10 days prior to when they will run out of medication.  A pharmacist will reach out to perform a clinical assessment periodically.  Informed patient that a welcome packet and a drug information handout will be sent.      Patient verbalized understanding of the above information as well as how to contact the pharmacy at [phone] option 4 with any questions/concerns.  The pharmacy is open Monday through Friday 8:30am-4:30pm.  A pharmacist is available 24/7 via pager to answer any clinical questions they may have.    Patient Specific Needs     ? Does the patient have any physical, cognitive, or cultural barriers? No    ? Patient prefers to have medications discussed with  Patient     ?  Is the patient able to read and understand education materials at a high school level or above? Yes    ? Patient's primary language is  English     ? Is the patient high risk? No     ? Does the patient require a Care Management Plan? No     ? Does the patient require physician intervention or other additional services (i.e. nutrition, smoking cessation, social work)? No      Thad Ranger  [facility] Specialty Pharmacist

## 2018-05-28 NOTE — Unmapped (Signed)
[facility] Specialty Medication Referral: No PA required    Medication (Brand/Generic): CREON    Initial Benefits Investigation Claim completed with resulted information below:  No PA required  Patient ABLE to fill at [facility]  Insurance Company:  PAP  Anticipated Copay: $0    As Co-pay is under $25 defined limit, per policy there will be no further investigation of need for financial assistance at this time unless patient requests. This referral has been communicated to the provider and handed off to the [facility] team for further processing and filling of prescribed medication.   ______________________________________________________________________  Please utilize this referral for viewing purposes as it will serve as the central location for all relevant documentation and updates.

## 2018-06-03 NOTE — Unmapped (Signed)
ISLET CELL PSYCHOSOCIAL NOTE     Sent [place] [employer] application via email and notified patients significant other, Nicole via email.     Carole Binning, LCSW-A  Islet Cell Transplant  Transplant Case Manager  06/03/2018. 3:37 PM

## 2018-06-05 NOTE — Unmapped (Signed)
The [facility] has made a third and final attempt to reach this patient to refill the following medication:creon.      We have left voicemails on the following phone numbers: [phone].    Dates contacted: 5/20, 5/26, 5/28  Last scheduled delivery: patient has never filled this medication at [facility] before, could not reach patient to onboard and set up first fill    The patient may be at risk of non-compliance with this medication. The patient should call the [facility] at [phone] (option 4) to refill medication.    Thad Ranger   [facility] Specialty Pharmacist

## 2018-06-06 MED ORDER — PANTOPRAZOLE 40 MG TABLET,DELAYED RELEASE
ORAL_TABLET | Freq: Two times a day (BID) | ORAL | 11 refills | 0.00000 days | Status: CP
Start: 2018-06-06 — End: 2019-06-06
  Filled 2018-06-06: qty 60, 30d supply, fill #0

## 2018-06-06 MED FILL — PANTOPRAZOLE 40 MG TABLET,DELAYED RELEASE: 30 days supply | Qty: 60 | Fill #0 | Status: AC

## 2018-06-06 MED FILL — CREON 36,000 UNIT-114,000 UNIT-180,000 UNIT CAPSULE,DELAYED RELEASE: 30 days supply | Qty: 420 | Fill #0 | Status: AC

## 2018-07-10 NOTE — Unmapped (Signed)
[facility] Clinical Assessment & Refill Coordination Note    Quang Thorpe, DOB: 12-01-1980  Phone: [phone] (home)     All above HIPAA information was verified with patient.     Specialty Medication(s):   Transplant: Creon 36,000-114,000-180,000     Current Outpatient Medications   Medication Sig Dispense Refill   ??? acetaminophen (TYLENOL) 325 MG tablet Take 2 tablets (650 mg total) by mouth Every six (6) hours. (Patient not taking: Reported on 02/19/2018) 60 tablet 1   ??? aspirin 81 MG chewable tablet Chew 1 tablet (81 mg total) daily. 30 tablet 11   ??? blood sugar diagnostic Strp Dispense 100 blood glucose test strips, ok to sub any brand preferred by insurance/patient, use 1x/day 100 strip 3   ??? carvedilol (COREG) 25 MG tablet Take 1 tablet (25 mg total) by mouth Two (2) times a day. (Patient not taking: Reported on 02/19/2018) 180 tablet 3   ??? flash glucose sensor (FREESTYLE LIBRE 14 DAY SENSOR) kit by Other route every fourteen (14) days. (Patient not taking: Reported on 02/19/2018) 2 each 11   ??? gabapentin (NEURONTIN) 300 MG capsule Take 2 capsules (600 mg total) by mouth Three (3) times a day. (Patient not taking: Reported on 02/19/2018) 270 capsule 3   ??? lipase-protease-amylase (CREON) 36,000-114,000- 180,000 unit CpDR Take 3 capsules by mouth with meals and 2 capsules by mouth with snacks as directed 420 capsule 11   ??? melatonin 3 mg Tab Take 2 tablets (6 mg total) by mouth every evening. 60 tablet 0   ??? MORPhine (MS CONTIN) 30 MG 12 hr tablet Take 1 tablet (30 mg total) by mouth Two (2) times a day. (Patient not taking: Reported on 02/19/2018) 10 tablet 0   ??? pantoprazole (PROTONIX) 40 MG tablet Take 1 tablet (40 mg total) by mouth Two (2) times a day. 60 tablet 11   ??? polyethylene glycol (MIRALAX) 17 gram/dose powder Take 17 g by mouth Two (2) times a day. Generic OK 255 g 11   ??? sucralfate (CARAFATE) 1 gram tablet Take 1 tablet (1 g total) by mouth Four (4) times a day. (Patient not taking: Reported on 02/19/2018) 360 tablet 3     No current facility-administered medications for this visit.         Changes to medications: Yanni reports no changes at this time.    Allergies   Allergen Reactions   ??? Penicillin Rash       Changes to allergies: No    SPECIALTY MEDICATION ADHERENCE     Creon 36,000-114,000-180,000 units: 7 days of medicine on hand        Specialty medication(s) dose(s) confirmed: Regimen is correct and unchanged.     Are there any concerns with adherence? No    Adherence counseling provided? Not needed    CLINICAL MANAGEMENT AND INTERVENTION      Clinical Benefit Assessment:    Do you feel the medicine is effective or helping your condition? Yes    Clinical Benefit counseling provided? Not needed    Adverse Effects Assessment:    Are you experiencing any side effects? No    Are you experiencing difficulty administering your medicine? No    Quality of Life Assessment:    How many days over the past month did your islet transplant  keep you from your normal activities? For example, brushing your teeth or getting up in the morning. 0    Have you discussed this with your  provider? Not needed    Therapy Appropriateness:    Is therapy appropriate? Yes, therapy is appropriate and should be continued    DISEASE/MEDICATION-SPECIFIC INFORMATION      N/A    PATIENT SPECIFIC NEEDS     ? Does the patient have any physical, cognitive, or cultural barriers? No    ? Is the patient high risk? No     ? Does the patient require a Care Management Plan? No     ? Does the patient require physician intervention or other additional services (i.e. nutrition, smoking cessation, social work)? No      SHIPPING     Specialty Medication(s) to be Shipped:   Transplant: Creon 36,000-114,000-180,000 units    Other medication(s) to be shipped: Pantoprazole     Changes to insurance: Yes: Waiting on approval for full pap.      Delivery Scheduled: Spoke with patient and told him we still havent received PAP application.  Told him to give [place] a call when resolved to we can schedule for delivery.      Medication will be delivered via UPS to the confirmed home address in [place].    The patient will receive a drug information handout for each medication shipped and additional FDA Medication Guides as required.  Verified that patient has previously received a Conservation officer, historic buildings.    All of the patient's questions and concerns have been addressed.    Tera Helper   [facility] Specialty Pharmacist

## 2018-07-25 NOTE — Unmapped (Signed)
Spoke with [facility] and PAP regarding PAP approval sending request to patient to provide taxes. 3 months bank statements,  Any income from this year. Even though patient is not working due to no available work, he is still listed as employed by [employer] and the pharmacy required documentation from the employer. patient used 14 day emergency fill in May but failed to return the application so is not eligible for another fill until the PAP is approved. Patient and fiance notified.    Of note, patient has admitted to returning to narcotic abuse; he states he  is being treated this weekend at the [facility] in [place] for detox and on Monday will transfer to a 10 week program in [place], [place]

## 2018-07-25 NOTE — Unmapped (Signed)
All forms scanned to patient needed for PAP application

## 2018-07-25 NOTE — Unmapped (Signed)
[facility] Refill Coordination Note  Medication: Creon    Unable to reach patient to schedule shipment for medication being filled at [facility]. Left voicemail on phone.  As this is the 3rd unsuccessful attempt to reach the patient, no additional phone call attempts will be made at this time.      Phone numbers attempted: [phone]  Last scheduled delivery: 06/06/18    Please call the [facility] at [phone] (option [phone]) should you have any further questions.      Thanks,  [facility] Specialty Team

## 2018-08-06 NOTE — Unmapped (Signed)
Patient has canceled several appointment  for hemorrhoids. Patient was suppose to follow up in 8wks and he cancelled his appointment for today as well.

## 2018-08-11 NOTE — Unmapped (Signed)
Patient did not answer phone. My chart message asking for return call to schedule into Dr. Humphrey Rolls clinic

## 2018-08-28 NOTE — Unmapped (Signed)
Fiance returned my call. Patient was seen at [facility] for epigastric pain over the weekend. Requested records from [facility]

## 2018-08-28 NOTE — Unmapped (Signed)
Left message for patient to return call. Needs follow up with Dr. Celine Mans and labs. Have been unable to contact patient for several months. Spoke with fiance Joni Reining) who stated that Mr. Covault has been having issues again with narcotic abuse. He was to be admitted for detox at a hospital in [place] but at the point of admission refused. Have offered our [facility] and transplant social work and psychology/psychiatry but patient has not responded.

## 2018-09-03 LAB — CBC W/ DIFFERENTIAL

## 2018-09-03 LAB — TACROLIMUS, TROUGH: Lab: 0

## 2018-09-03 LAB — NEUTROPHILS RELATIVE PERCENT: Lab: 0

## 2018-09-03 LAB — TACROLIMUS LEVEL, TROUGH

## 2018-09-18 NOTE — Unmapped (Signed)
Patient called stating he has had a dry mouth, blurry vision, large urine volume. Asked him to go to [facility] immediately for labs; appointment for Wednesday 9/16.

## 2018-09-19 ENCOUNTER — Ambulatory Visit: Admit: 2018-09-19 | Discharge: 2018-09-20

## 2018-09-22 NOTE — Unmapped (Signed)
Unable to contact patient. Spoke with domestic partner - patient is hospitalized and phone is not charged. Blood sugar >800 on admission to [facility]. Requested medical records. Requesting follow up with Endocrine. Domestic partner states she can make the appointment.   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

## 2018-09-22 NOTE — Unmapped (Signed)
Spoke with Joni Reining (domestic partner). Blood sugar was over 800 on admission at [facility]. Medical records requested. He is still int he hospital as of today. Talked at length with partner about the need to get the pharmacy assistance and charity care application completed. Will ask endocrine if it is possible  to set up video appointment for follow up. Mr. Cortese and partner are working on transportation for visit here with Dr. Celine Mans.   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

## 2018-09-30 NOTE — Unmapped (Signed)
LVM for pt in regards to appt with Dr. Yetta Barre on 10/01/18 at 2:30pm. Sent MyChart message as well.

## 2018-10-01 ENCOUNTER — Encounter: Admit: 2018-10-01 | Discharge: 2018-10-02 | Payer: PRIVATE HEALTH INSURANCE

## 2018-10-01 DIAGNOSIS — E139 Other specified diabetes mellitus without complications: Secondary | ICD-10-CM

## 2018-10-01 MED ORDER — INSULIN LISPRO (U-100) 100 UNIT/ML SUBCUTANEOUS PEN
3 refills | 0 days | Status: CP
Start: 2018-10-01 — End: ?

## 2018-10-01 MED ORDER — BLOOD SUGAR DIAGNOSTIC STRIPS
ORAL_STRIP | 3 refills | 0 days | Status: CP
Start: 2018-10-01 — End: ?

## 2018-10-01 MED ORDER — INSULIN NPH ISOPHANE U-100 HUMAN 100 UNIT/ML (3 ML) SUBCUTANEOUS PEN
Freq: Two times a day (BID) | SUBCUTANEOUS | 1 refills | 125.00000 days | Status: CP
Start: 2018-10-01 — End: ?

## 2018-10-01 NOTE — Unmapped (Signed)
ENDOCRINE/DIABETES PATIENT VISIT BY PHONE/VIDEOCALL (TELEVISIT PHONE)    I spent 30 minutes on the real-time audio and video which was changed to telephone visit with the patient. I spent an additional 5 minutes on pre- and post-visit activities.     The patient was physically located in [place] or a state in which I am permitted to provide care. The patient and/or parent/guardian understood that s/he may incur co-pays and cost sharing, and agreed to the telemedicine visit. The visit was reasonable and appropriate under the circumstances given the patient's presentation at the time.    The patient and/or parent/guardian has been advised of the potential risks and limitations of this mode of treatment (including, but not limited to, the absence of in-person examination) and has agreed to be treated using telemedicine. The patient's/patient's family's questions regarding telemedicine have been answered.     If the visit was completed in an ambulatory setting, the patient and/or parent/guardian has also been advised to contact their provider???s office for worsening conditions, and seek emergency medical treatment and/or call 911 if the patient deems either necessary.        Consent was obtained for this visit.    Total time: 35 minutes     Subjective:     History of Present Illness:  38 y.o. male called for new problem of diabetes. He has a history of TPAIT and recently developed diabetes which led to a hospital admission.    Interim history notable for being hospitalized for hyperglycemia > 800. He denies any illness, drug use or any other inciting event prior to his hyperglycemia. He reports that he had been doing well with carb counting and checking his blood sugar 2 - 3 times a week before all this happened. His blood sugars were typically in the 90s when fasting prior to about a month ago. He started feeling generalized malaise, polyuria and developed weight loss at that time. He has lost over 20 pounds. It is unclear if he was checking his blood sugar regularly before his admission. He was admitted for 5 days and discharged on Lantus (Glargine). He can't afford the Lantus (Glargine), so now he is taking Humalog (Lispro) only. He has been supplied with a meter from a local pharmacy for free.    He is now checking his blood sugars before meals and 1 - 2 hours after eating.    Blood sugars are typically in the 200 range. He is also having some blood sugars that are < 50 and others that are > 300. The lowest blood sugar was in the 40s and the highest was in the 460s. Lows are typically happening after dinner and into the evenings. He has highly variable blood sugars because he is taking correction insulin only. No meal or basal.    Relevant medications and doses, for conditions being followed by me:  Humalog (Lispro) per sliding scale 1 - 5 units with meals based on correction scale.    Past Medical History:  Past Medical History:   Diagnosis Date   ??? GERD (gastroesophageal reflux disease)    ??? Hypertension    ??? Kidney stone    ??? Pancreatitis        Current Outpatient Medications   Medication Sig Dispense Refill   ??? acetaminophen (TYLENOL) 325 MG tablet Take 2 tablets (650 mg total) by mouth Every six (6) hours. (Patient not taking: Reported on 02/19/2018) 60 tablet 1   ??? aspirin 81 MG chewable tablet Chew 1 tablet (  81 mg total) daily. 30 tablet 11   ??? blood sugar diagnostic Strp Dispense 100 blood glucose test strips, ok to sub any brand preferred by insurance/patient, use 1x/day 100 strip 3   ??? carvedilol (COREG) 25 MG tablet Take 1 tablet (25 mg total) by mouth Two (2) times a day. (Patient not taking: Reported on 02/19/2018) 180 tablet 3   ??? flash glucose sensor (FREESTYLE LIBRE 14 DAY SENSOR) kit by Other route every fourteen (14) days. (Patient not taking: Reported on 02/19/2018) 2 each 11   ??? gabapentin (NEURONTIN) 300 MG capsule Take 2 capsules (600 mg total) by mouth Three (3) times a day. (Patient not taking: Reported on 02/19/2018) 270 capsule 3   ??? lipase-protease-amylase (CREON) 36,000-114,000- 180,000 unit CpDR Take 3 capsules by mouth with meals and 2 capsules by mouth with snacks as directed 420 capsule 11   ??? melatonin 3 mg Tab Take 2 tablets (6 mg total) by mouth every evening. 60 tablet 0   ??? MORPhine (MS CONTIN) 30 MG 12 hr tablet Take 1 tablet (30 mg total) by mouth Two (2) times a day. (Patient not taking: Reported on 02/19/2018) 10 tablet 0   ??? pantoprazole (PROTONIX) 40 MG tablet Take 1 tablet (40 mg total) by mouth Two (2) times a day. 60 tablet 11   ??? polyethylene glycol (MIRALAX) 17 gram/dose powder Take 17 g by mouth Two (2) times a day. Generic OK 255 g 11   ??? sucralfate (CARAFATE) 1 gram tablet Take 1 tablet (1 g total) by mouth Four (4) times a day. (Patient not taking: Reported on 02/19/2018) 360 tablet 3     No current facility-administered medications for this visit.        Allergies:  Allergies   Allergen Reactions   ??? Penicillin Rash       Family History:  Family History   Problem Relation Age of Onset   ??? Kidney disease Mother    ??? Hypertension Mother    ??? Cancer Father         Social History:  Social History     Tobacco Use   ??? Smoking status: Former Smoker     Packs/day: 0.50     Types: Cigarettes   ??? Smokeless tobacco: Never Used   Substance Use Topics   ??? Alcohol use: No   ??? Drug use: No       Review of Systems:  Negative except as per HPI.      Objective:       Lab Review:  Lab Results   Component Value Date    A1C 5.7 01/13/2018       Lab Results   Component Value Date    CREATININE 0.73 01/03/2018       No results found for: TSH      Radiology Review:  None    Other Medical Data:  None    Assessment/Plan:  1. 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 Poorly controlled with hyperglycemia.  Diabetes Regimen:    When To Check Blood Glucose Level:  Before Breakfast Before Lunch Before Dinner Before Bed   X X X X   Basal Insulin (long acting): NPH   Before Breakfast Before Lunch Before Dinner Before Bed   6   6   Prandial Insulin (before meals): Humalog (Lispro)    Before Breakfast Before Lunch Before Dinner Before Bed   1 unit for every 15 grams of carbohydrates  1 unit for  every 15 grams of carbohydrates  1 unit for every 15 grams of carbohydrates     Correction insulin (sliding scale): Humalog (Lispro)   Before meals: Add this to your prandial insulin (before meal insulin). Only do this up to 3 times per day and makes sure there is at least 4 hours between correction doses.  If blood glucose level is 151 - 200, give 1 unit of insulin.  If blood glucose level is 201 - 250, give 2 units of insulin.  If blood glucose level is 251 - 300, give 3 units of insulin.  If blood glucose level is 301 - 350, give 4 units of insulin.  If blood glucose level is > 350, give 5 units of insulin.       Follow up: 1 - 2 weeks with Dr. Ledon Snare. 4 - 6 weeks with me.    Alben Spittle, MD  [facility] Department of Endocrinology

## 2018-10-01 NOTE — Unmapped (Addendum)
It was nice to speak with you today.    Please do the following for your diabetes  Diabetes Regimen:    When To Check Blood Glucose Level:  Before Breakfast Before Lunch Before Dinner Before Bed   X X X X   Basal Insulin (long acting): NPH   Before Breakfast Before Lunch Before Dinner Before Bed   6   6   Prandial Insulin (before meals): Humalog (Lispro)    Before Breakfast Before Lunch Before Dinner Before Bed   1 unit for every 15 grams of carbohydrates  1 unit for every 15 grams of carbohydrates  1 unit for every 15 grams of carbohydrates     Correction insulin (sliding scale): Humalog (Lispro)   Before meals: Add this to your prandial insulin (before meal insulin). Only do this up to 3 times per day and makes sure there is at least 4 hours between correction doses.  If blood glucose level is 151 - 200, give 1 unit of insulin.  If blood glucose level is 201 - 250, give 2 units of insulin.  If blood glucose level is 251 - 300, give 3 units of insulin.  If blood glucose level is 301 - 350, give 4 units of insulin.  If blood glucose level is > 350, give 5 units of insulin.    If the NPH insulin is expensive at your pharmacy, you should be able to get it over the counter at [facility].    Follow up with Dr. Ledon Snare, next available in about 1 - 2 weeks. Follow up with me in about 4 - 6 weeks.    Please let me know if there are any questions or concerns.    Sincerely,  Alben Spittle, MD

## 2018-12-06 IMAGING — US US ABDOMEN COMPLETE
1 series · 14 of 25 positions shown · non-contrast
Comparison: CT abdomen and pelvis 07/02/2016..

CLINICAL DATA: Transaminitis. Acute pancreatitis. Abdominal pain
for 2 weeks.

EXAM:
ABDOMEN ULTRASOUND COMPLETE

[Series 1: us abdomen complete · 0.25mm/px · 14 of 102 slices shown]
[im 1/102]
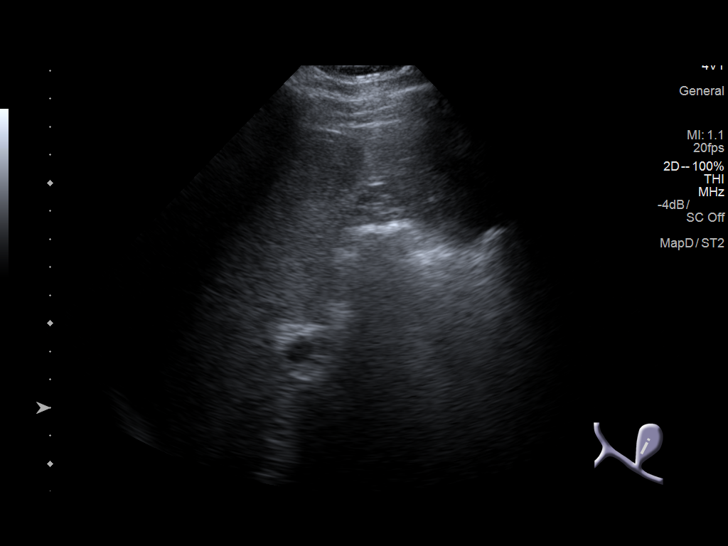
[im 9/102]
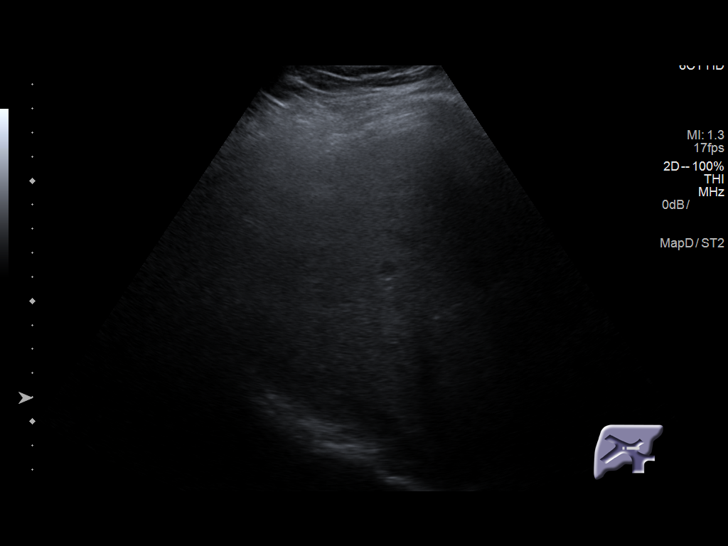
[im 17/102]
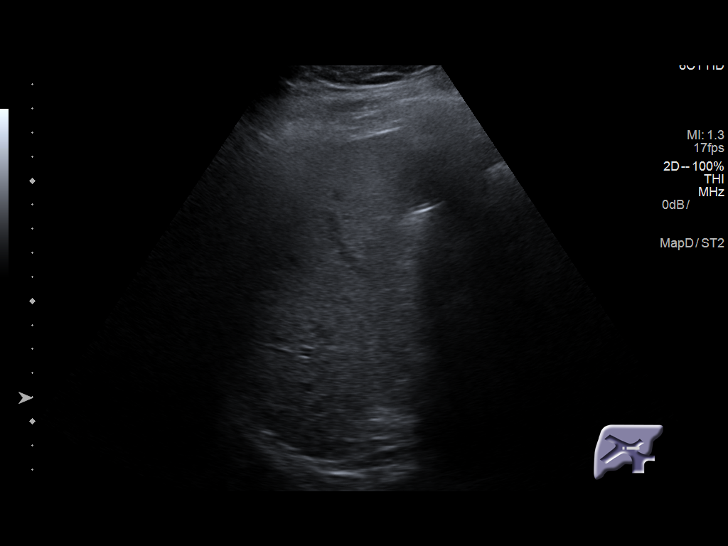
[im 26/102]
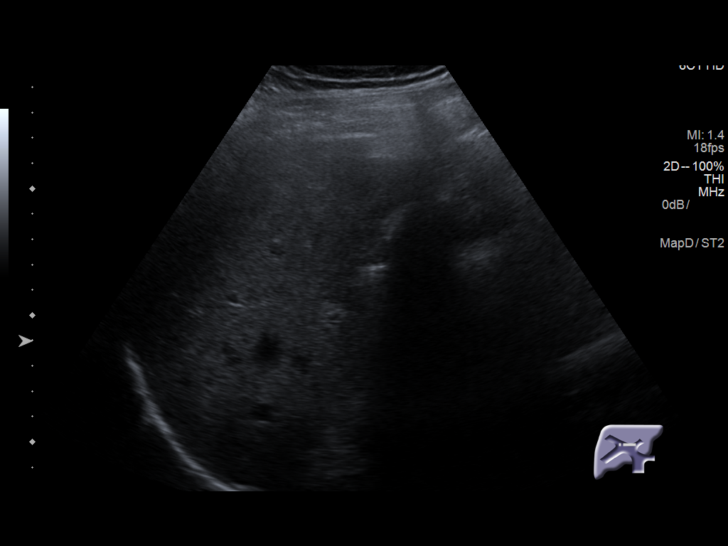
[im 34/102]
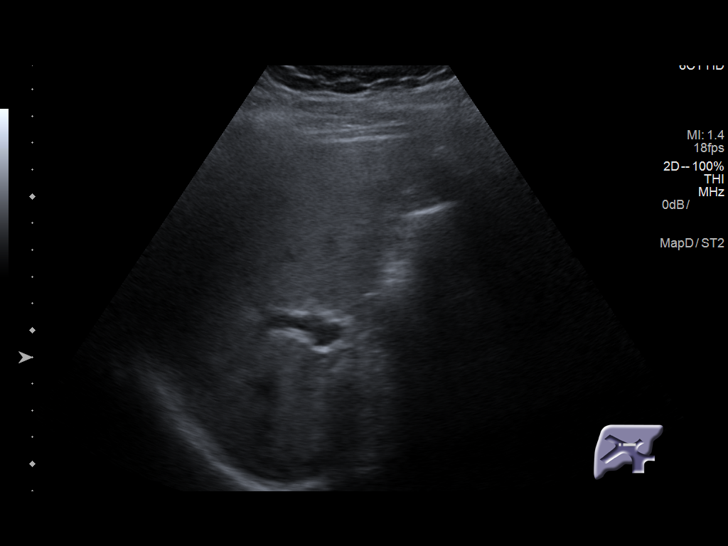
[im 38/102]
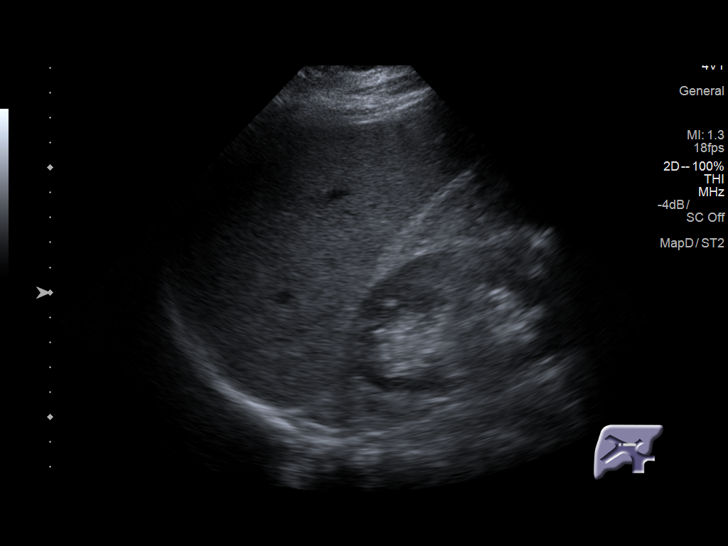
[im 47/102]
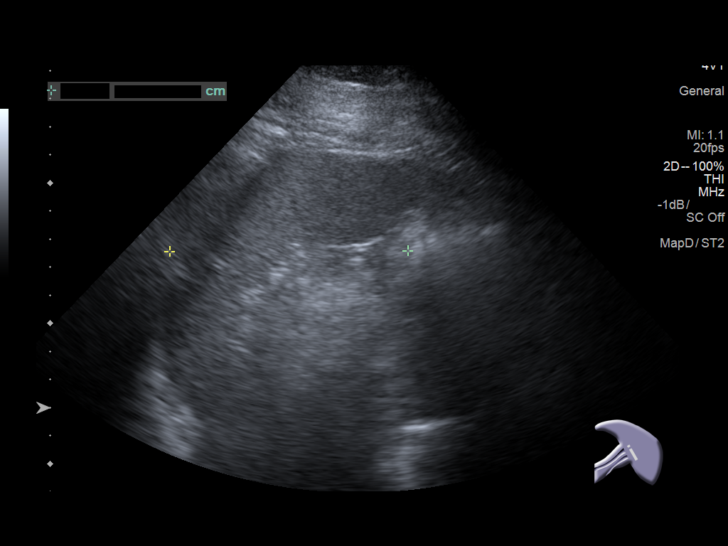
[im 55/102]
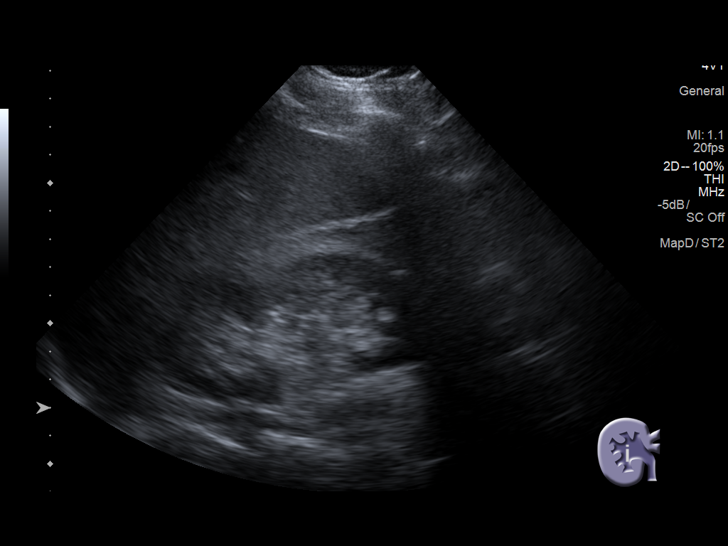
[im 64/102]
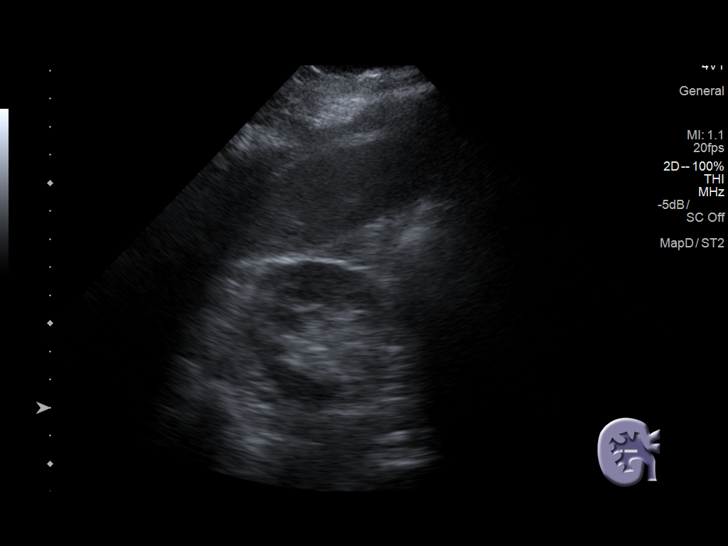
[im 68/102]
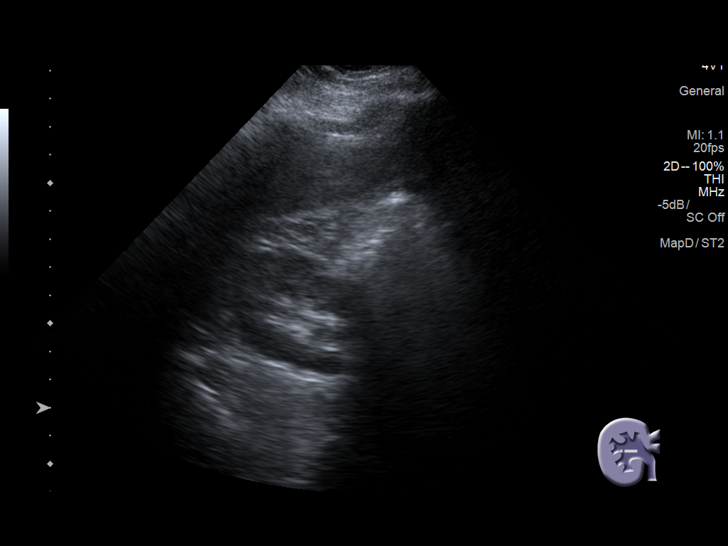
[im 76/102]
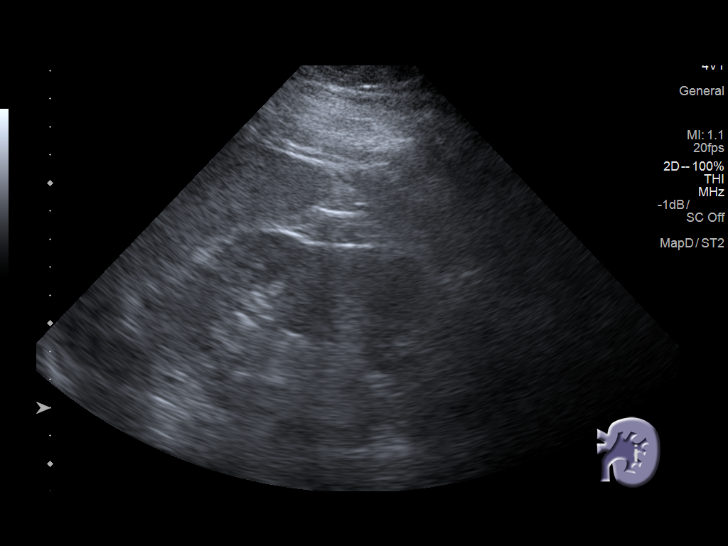
[im 85/102]
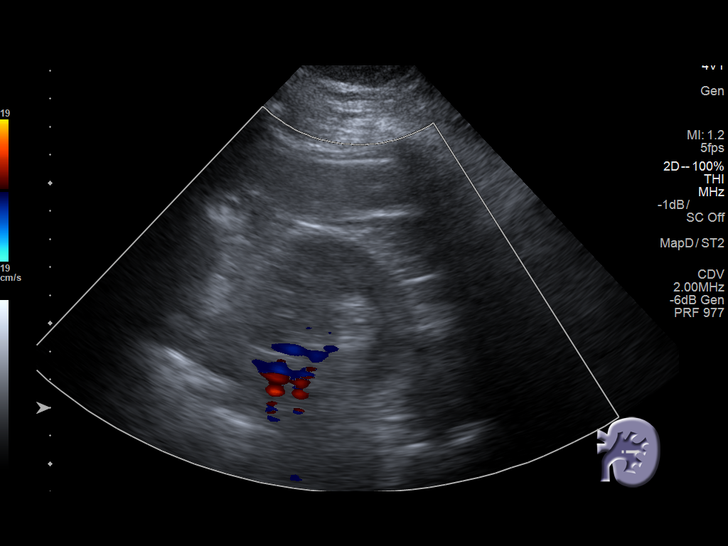
[im 93/102]
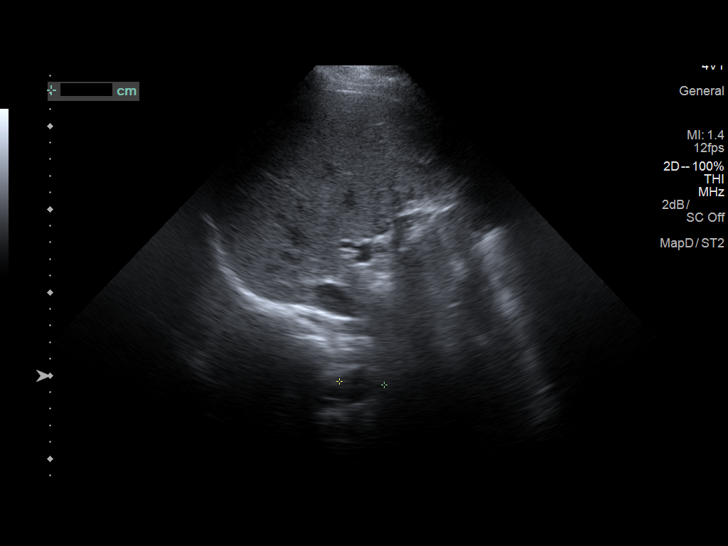
[im 102/102]
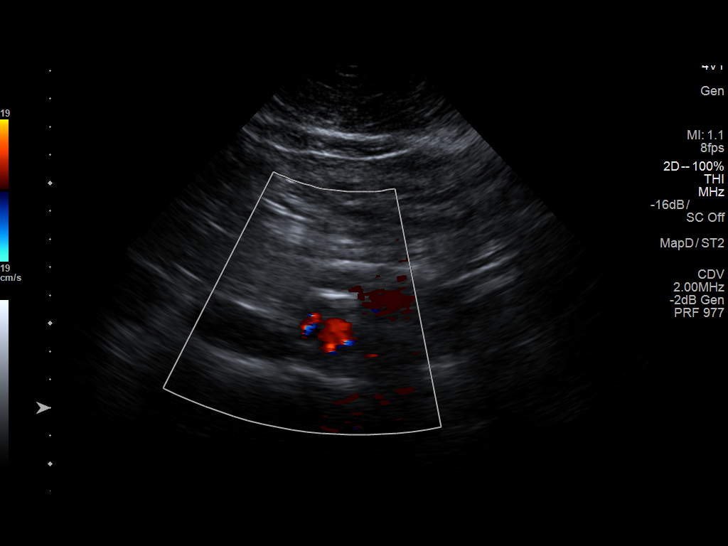

[14 of 25 positions shown; findings below may reference images not displayed]

FINDINGS: Gallbladder: Gallbladder surgically absent.

Common bile duct: Diameter: Normal, 4 mm.

Liver: Increased echogenicity. No focal lesion identified. Portal
vein is patent on color Doppler imaging with normal direction of
blood flow towards the liver.

IVC: No abnormality visualized.

Pancreas: Not visualized due to bowel gas.

Spleen: Size and appearance within normal limits.

Right Kidney: Length: Normal, 11 cm.. Echogenicity within normal
limits. No mass or hydronephrosis visualized.

Left Kidney: Length: Normal, 11.5 cm.. Echogenicity within normal
limits. No mass or hydronephrosis visualized.

Abdominal aorta: No aneurysm visualized.

Other findings: None.
IMPRESSION: Status post cholecystectomy. Hepatic steatosis. No mass, or biliary
ductal dilatation.

Pancreas not visualized due to bowel gas.

## 2018-12-09 NOTE — Unmapped (Signed)
Spoke with fiance Marcelino Duster. She states blood sugars have returned to normal baseline; no insulin. No illegal substances X 3 months. Has some rectal bleeding- hx of hemorrhoid. Suggested return to the [facility] Surgery team at [facility] that he was previously referred to for hemorrhoid treatment and if he could not afford that we would refer to GI. Asked for updated labs and a return visit to Dr. Celine Mans. He also needs to follow up with Dr. Ledon Snare and Dr. Yetta Barre at [facility].   ASked for updte on pharmacy assist and [facility] application. At last check they still needed updated tax forms

## 2019-04-08 ENCOUNTER — Ambulatory Visit: Admit: 2019-04-08 | Discharge: 2019-04-09 | Attending: Physician Assistant | Primary: Physician Assistant

## 2019-04-08 DIAGNOSIS — Z90410 Acquired total absence of pancreas: Principal | ICD-10-CM

## 2019-04-08 DIAGNOSIS — Z9483 Pancreas transplant status: Principal | ICD-10-CM

## 2019-04-08 LAB — LIPID PANEL
CHOLESTEROL/HDL RATIO SCREEN: 2.3 (ref <5.0)
CHOLESTEROL: 108 mg/dL (ref 100–199)
HDL CHOLESTEROL: 46 mg/dL (ref 40–59)
NON-HDL CHOLESTEROL: 62 mg/dL
TRIGLYCERIDES: 61 mg/dL (ref 1–149)
VLDL CHOLESTEROL CAL: 12.2 mg/dL (ref 10–50)

## 2019-04-08 LAB — COMPREHENSIVE METABOLIC PANEL
ALBUMIN: 4.2 g/dL (ref 3.5–5.0)
ALKALINE PHOSPHATASE: 148 U/L — ABNORMAL HIGH (ref 38–126)
ALT (SGPT): 23 U/L (ref <50)
AST (SGOT): 23 U/L (ref 19–55)
BILIRUBIN TOTAL: 0.5 mg/dL (ref 0.0–1.2)
BLOOD UREA NITROGEN: 11 mg/dL (ref 7–21)
BUN / CREAT RATIO: 15
CALCIUM: 8.8 mg/dL (ref 8.5–10.2)
CHLORIDE: 105 mmol/L (ref 98–107)
CO2: 26 mmol/L (ref 22.0–30.0)
CREATININE: 0.75 mg/dL (ref 0.70–1.30)
EGFR CKD-EPI AA MALE: >90 mL/min/{1.73_m2} (ref >=60)
EGFR CKD-EPI NON-AA MALE: >90 mL/min/{1.73_m2} (ref >=60)
GLUCOSE RANDOM: 228 mg/dL — ABNORMAL HIGH (ref 70–179)
POTASSIUM: 4.9 mmol/L (ref 3.5–5.0)
PROTEIN TOTAL: 6.5 g/dL (ref 6.5–8.3)
SODIUM: 139 mmol/L (ref 135–145)

## 2019-04-08 LAB — BILIRUBIN DIRECT: Bilirubin.glucuronidated+Bilirubin.albumin bound:MCnc:Pt:Ser/Plas:Qn:: 0.1

## 2019-04-08 LAB — CBC W/ AUTO DIFF
BASOPHILS ABSOLUTE COUNT: 0.1 10*9/L (ref 0.0–0.1)
BASOPHILS RELATIVE PERCENT: 0.8 %
EOSINOPHILS ABSOLUTE COUNT: 0.4 10*9/L (ref 0.0–0.4)
EOSINOPHILS RELATIVE PERCENT: 4.2 %
HEMATOCRIT: 44.4 % (ref 41.0–53.0)
HEMOGLOBIN: 13.9 g/dL (ref 13.5–17.5)
LARGE UNSTAINED CELLS: 1 % (ref 0–4)
LYMPHOCYTES ABSOLUTE COUNT: 2.8 10*9/L (ref 1.5–5.0)
LYMPHOCYTES RELATIVE PERCENT: 27.8 %
MEAN CORPUSCULAR HEMOGLOBIN CONC: 31.3 g/dL (ref 31.0–37.0)
MEAN CORPUSCULAR HEMOGLOBIN: 27.3 pg (ref 26.0–34.0)
MEAN CORPUSCULAR VOLUME: 87.3 fL (ref 80.0–100.0)
MEAN PLATELET VOLUME: 11.9 fL — ABNORMAL HIGH (ref 7.0–10.0)
MONOCYTES ABSOLUTE COUNT: 0.7 10*9/L (ref 0.2–0.8)
NEUTROPHILS RELATIVE PERCENT: 59.2 %
PLATELET COUNT: 343 10*9/L (ref 150–440)
RED CELL DISTRIBUTION WIDTH: 14.9 % (ref 12.0–15.0)
WBC ADJUSTED: 10 10*9/L (ref 4.5–11.0)

## 2019-04-08 LAB — HEMOGLOBIN A1C
HEMOGLOBIN A1C: 9.1 % — ABNORMAL HIGH (ref 4.8–5.6)
Hemoglobin A1c/Hemoglobin.total:MFr:Pt:Bld:Qn:: 9.1 — ABNORMAL HIGH

## 2019-04-08 LAB — WBC ADJUSTED: Leukocytes:NCnc:Pt:Bld:Qn:: 10

## 2019-04-08 LAB — BILIRUBIN TOTAL: Bilirubin:MCnc:Pt:Ser/Plas:Qn:: 0.5

## 2019-04-08 LAB — TRIGLYCERIDES: Triglyceride:MCnc:Pt:Ser/Plas:Qn:: 61
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
## 2019-04-08 NOTE — Unmapped (Signed)
Patient seen in clinic today, received meningococcal B, meningococcal conjugate, and pneumo-23 on left deltoid per pts request. No complications noted pt tolerated well, FAQ sheet sent home with patient.

## 2019-04-09 NOTE — Unmapped (Signed)
Left message for patient and fiance regarding labs. Of note HA1C is 9.1. Patient had reported no insulin need in office visit yesterday. Reminded him to make a follow up appointment with Dr. Yetta Barre and to check his glucose more frequently.

## 2019-04-10 LAB — C-PEPTIDE: C peptide^post CFst:MCnc:Pt:Ser/Plas:Qn:: 2.5

## 2019-04-16 DIAGNOSIS — Z9483 Pancreas transplant status: Principal | ICD-10-CM

## 2019-04-16 MED ORDER — FREESTYLE LIBRE 14 DAY SENSOR KIT: each | 0 refills | 14 days | Status: AC

## 2019-04-16 MED ORDER — FREESTYLE LIBRE 14 DAY SENSOR KIT
0 refills | 14.00000 days | Status: CP
Start: 2019-04-16 — End: 2019-04-30

## 2019-04-30 DIAGNOSIS — Z9483 Pancreas transplant status: Principal | ICD-10-CM

## 2019-05-01 DIAGNOSIS — Z9483 Pancreas transplant status: Principal | ICD-10-CM

## 2019-05-01 MED ORDER — FREESTYLE LIBRE 14 DAY SENSOR KIT
11 refills | 0 days | Status: CP
Start: 2019-05-01 — End: 2019-05-15

## 2019-05-01 NOTE — Unmapped (Signed)
Pt request for RX Refill

## 2019-05-25 ENCOUNTER — Ambulatory Visit: Admit: 2019-05-25 | Discharge: 2019-05-26 | Payer: PRIVATE HEALTH INSURANCE

## 2019-05-25 DIAGNOSIS — Z90410 Acquired total absence of pancreas: Principal | ICD-10-CM

## 2019-05-26 NOTE — Unmapped (Signed)
Importing CT from [facility] in [place]; patient presented over the weekend with abd pain. Labs WNL. Prliminary report from [facility] was no obstruction or ID of area to cause pain.

## 2019-05-28 LAB — COMPREHENSIVE METABOLIC PANEL
ALKALINE PHOSPHATASE: 107 U/L
ALT (SGPT): 23 U/L
BLOOD UREA NITROGEN: 9 mg/dL
CALCIUM: 9 mg/dL
CHLORIDE: 101 mmol/L
CO2: 25 mmol/L
EGFR CKD-EPI NON-AA MALE: 90 mL/min/{1.73_m2}
GLUCOSE RANDOM: 465 mg/dL
LACTIC ACID, PLASMA: 1.7
MAGNESIUM: 2.1
POTASSIUM: 3.8 mmol/L
PROTEIN TOTAL: 7.1 g/dL
SODIUM: 135 mmol/L

## 2019-05-28 LAB — HEMOGLOBIN: Lab: 13.9

## 2019-05-28 LAB — POTASSIUM: Lab: 3.8

## 2019-05-28 LAB — CBC
HEMATOCRIT: 42.7 %
HEMOGLOBIN: 13.9 g/dL
MEAN CORPUSCULAR VOLUME: 83.4 fL
PLATELET COUNT: 299 10*9/L
WBC ADJUSTED: 11.3 10*9/L

## 2019-05-28 NOTE — Unmapped (Signed)
Spoke with patient who is still having upper abdominal pain originating upper mid abd radiating to back. Is on Protonix and Carafate. Note CT from [place]- in Epic from Sunday 5/17. Reports constant gnawing pain upper abdomen. Only relief with yogurt. Has completed a miralax clean out. Has lost at least 10 pounds in 2 weeks. Referral to GI; also advised patient to come to ED for pain if there is no relief.     Labs from [place] entered into [employer]

## 2019-06-02 NOTE — Unmapped (Signed)
This patient has been disenrolled from the [facility] specialty pharmacy services due to inability of the pharmacy to reach the patient, clinic is aware.    Thad Ranger  [facility] Specialty Pharmacist

## 2019-06-10 ENCOUNTER — Ambulatory Visit: Admit: 2019-06-10 | Discharge: 2019-06-11

## 2019-06-10 DIAGNOSIS — E139 Other specified diabetes mellitus without complications: Principal | ICD-10-CM

## 2019-06-10 MED ORDER — INSULIN LISPRO (U-100) 100 UNIT/ML SUBCUTANEOUS PEN
3 refills | 0 days | Status: CP
Start: 2019-06-10 — End: ?

## 2019-06-10 MED ORDER — TRESIBA FLEXTOUCH U-100 INSULIN 100 UNIT/ML (3 ML) SUBCUTANEOUS PEN
Freq: Every day | SUBCUTANEOUS | 3 refills | 75.00000 days | Status: CP
Start: 2019-06-10 — End: ?

## 2019-06-10 MED ORDER — INSULIN NPH ISOPHANE U-100 HUMAN 100 UNIT/ML (3 ML) SUBCUTANEOUS PEN
Freq: Two times a day (BID) | SUBCUTANEOUS | 1 refills | 125 days | Status: CN
Start: 2019-06-10 — End: ?

## 2019-06-10 MED ORDER — PEN NEEDLE, DIABETIC 32 GAUGE X 5/32" (4 MM)
3 refills | 0.00000 days | Status: CP
Start: 2019-06-10 — End: ?

## 2019-06-24 DIAGNOSIS — Z9483 Pancreas transplant status: Principal | ICD-10-CM

## 2019-06-24 DIAGNOSIS — Z90410 Acquired total absence of pancreas: Principal | ICD-10-CM

## 2019-06-24 DIAGNOSIS — K2900 Acute gastritis without bleeding: Principal | ICD-10-CM

## 2019-06-24 DIAGNOSIS — Z9489 Other transplanted organ and tissue status: Principal | ICD-10-CM

## 2019-06-26 ENCOUNTER — Ambulatory Visit
Admit: 2019-06-26 | Attending: Pharmacist Clinician (PhC)/ Clinical Pharmacy Specialist | Primary: Pharmacist Clinician (PhC)/ Clinical Pharmacy Specialist

## 2019-07-15 MED ORDER — FREESTYLE LIBRE 14 DAY SENSOR KIT
0 days
Start: 2019-07-15 — End: ?

## 2019-07-19 ENCOUNTER — Ambulatory Visit: Admit: 2019-07-19 | Discharge: 2019-07-20 | Attending: Internal Medicine | Primary: Internal Medicine

## 2019-07-19 ENCOUNTER — Ambulatory Visit: Admit: 2019-07-19 | Discharge: 2019-07-20 | Payer: PRIVATE HEALTH INSURANCE

## 2019-07-19 DIAGNOSIS — M549 Dorsalgia, unspecified: Principal | ICD-10-CM

## 2019-07-19 DIAGNOSIS — M25551 Pain in right hip: Principal | ICD-10-CM

## 2019-07-19 MED ORDER — CYCLOBENZAPRINE 10 MG TABLET
ORAL_TABLET | Freq: Every evening | ORAL | 0 refills | 14 days | Status: CP | PRN
Start: 2019-07-19 — End: ?

## 2019-07-19 MED ORDER — MELOXICAM 7.5 MG TABLET
ORAL_TABLET | Freq: Two times a day (BID) | ORAL | 1 refills | 7 days | Status: CP | PRN
Start: 2019-07-19 — End: 2019-07-26

## 2019-07-26 ENCOUNTER — Encounter
Admit: 2019-07-26 | Discharge: 2019-07-27 | Payer: PRIVATE HEALTH INSURANCE | Attending: Nurse Practitioner | Primary: Nurse Practitioner

## 2019-07-26 DIAGNOSIS — M25551 Pain in right hip: Principal | ICD-10-CM

## 2019-07-26 DIAGNOSIS — M545 Low back pain: Principal | ICD-10-CM

## 2019-07-26 MED ORDER — DICLOFENAC SODIUM 50 MG TABLET,DELAYED RELEASE
ORAL_TABLET | Freq: Two times a day (BID) | ORAL | 0 refills | 14.00000 days | Status: CP
Start: 2019-07-26 — End: 2019-08-09

## 2019-07-26 MED ORDER — PREDNISONE 5 MG TABLET
ORAL_TABLET | 0 refills | 0 days | Status: CP
Start: 2019-07-26 — End: ?

## 2019-08-12 ENCOUNTER — Ambulatory Visit
Admit: 2019-08-12 | Discharge: 2019-08-13 | Attending: Student in an Organized Health Care Education/Training Program | Primary: Student in an Organized Health Care Education/Training Program

## 2019-08-12 DIAGNOSIS — Z9483 Pancreas transplant status: Principal | ICD-10-CM

## 2019-08-12 DIAGNOSIS — K921 Melena: Principal | ICD-10-CM

## 2019-08-12 DIAGNOSIS — K2900 Acute gastritis without bleeding: Principal | ICD-10-CM

## 2019-08-12 DIAGNOSIS — R109 Unspecified abdominal pain: Principal | ICD-10-CM

## 2019-08-12 DIAGNOSIS — Z90410 Acquired total absence of pancreas: Principal | ICD-10-CM

## 2019-08-12 DIAGNOSIS — Z9489 Other transplanted organ and tissue status: Principal | ICD-10-CM

## 2019-08-12 DIAGNOSIS — R197 Diarrhea, unspecified: Principal | ICD-10-CM

## 2019-08-12 MED ORDER — DICYCLOMINE 10 MG CAPSULE
ORAL_CAPSULE | Freq: Four times a day (QID) | ORAL | 3 refills | 30.00000 days | Status: CP | PRN
Start: 2019-08-12 — End: 2020-08-11

## 2019-08-18 MED ORDER — PEG 3350-ELECTROLYTES 236 GRAM-22.74 GRAM-6.74 GRAM-5.86 GRAM SOLUTION
0 refills | 0.00000 days | Status: CP
Start: 2019-08-18 — End: ?
  Filled 2019-08-19: qty 4000, 1d supply, fill #0

## 2019-08-19 ENCOUNTER — Ambulatory Visit (INDEPENDENT_AMBULATORY_CARE_PROVIDER_SITE_OTHER): Payer: Worker's Compensation | Admitting: Orthopaedic Surgery

## 2019-08-19 ENCOUNTER — Other Ambulatory Visit: Payer: Self-pay

## 2019-08-19 ENCOUNTER — Encounter: Payer: Self-pay | Admitting: Orthopaedic Surgery

## 2019-08-19 DIAGNOSIS — M5441 Lumbago with sciatica, right side: Secondary | ICD-10-CM

## 2019-08-19 DIAGNOSIS — M545 Low back pain, unspecified: Secondary | ICD-10-CM | POA: Insufficient documentation

## 2019-08-19 MED FILL — PEG 3350-ELECTROLYTES 236 GRAM-22.74 GRAM-6.74 GRAM-5.86 GRAM SOLUTION: 1 days supply | Qty: 4000 | Fill #0 | Status: AC

## 2019-08-19 NOTE — Progress Notes (Signed)
Office Visit Note   Patient: Darren Adams           Date of Birth: 06-22-1980           MRN: [id number] Visit Date: 08/19/2019              Requested by: Medicine, [facility] [address] [place],  [place] [address] PCP: Medicine, [facility]   Assessment & Plan: Visit Diagnoses:  1. Acute right-sided low back pain with right-sided sciatica     Plan: Mr. Garman experienced acute onset of low back pain after an on-the-job injury July 17, 2019.  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  On that particular day he was carrying a box and slipped on the icy floor.  He landed on his buttock and his back.  He went home without too much discomfort until the next day when he was so uncomfortable he had difficult time getting out of bed.  "I felt like I was beaten by a baseball bat".  He was seen at a local urgent care with x-rays not demonstrating any particular pathology.  He was seen again in follow-up within a week without much change in his symptoms.  He has been applying ice and heat and taking NSAIDs.  He had a steroid Dosepak and also has muscle relaxants.  He has had trouble sleeping because of the predominant discomfort in his back he also has been experiencing right buttock and right thigh pain.  He is having more back than leg pain.  He has experienced some "pins-and-needles" in the right thigh.  He has had some difficulty going to the bathroom because of the pain.  He has not worked since the injury. By exam there is no alert neurologic deficit.  Given the severity of his pain for that length of time I would suggest an MRI scan.  No new medicines.  Continue out of work until he is reevaluated .  Will apply lumbar support to help alleviate some of his discomfort  Follow-Up Instructions: Return After MRI scan lumbar spine.   Orders:  No orders of the defined types were placed in this encounter.  No orders of the defined types were placed in this encounter.     Procedures: No  procedures performed   Clinical Data: No additional findings.   Subjective: Chief Complaint  Patient presents with  . Right Hip - Injury, Pain    DOI 07/17/2019  Patient presents today for his right hip. He injured his hip while at work on 07/17/2019. He states that he was in the walk in freezer at [employer] and slipped. He landed on his right buttock. He went to an urgent care in [place] and had x-rays a couple days later. His pain is located in his right buttock, right lower back, and radiates down the backside of his thigh. He has "pins and needles" in the same areas of pain. No groin pain. He states that his right leg feels weaker at times. He is taking Ibuprofen for pain. 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 He has never had issues with his back before this injury.  Some difficulty going to the bathroom because of his pain.  He is tried NSAIDs, muscle relaxant and even a course of steroids without much relief.  Pain is predominately in his back with some referred pain to the right thigh.  He has not worked since the injury on July 9.  I did review the films  on the PACS system.  There was slight abnormality of the superior endplate of L2 vertebrae which may or may not be consistent with a compression fracture.  Otherwise I did not see any abnormality.  No listhesis or scoliosis  HPI  Review of Systems   Objective: Vital Signs: Ht 6\' 1"  (1.854 m)   Wt 190 lb (86.2 kg)   BMI 25.07 kg/m   Physical Exam Constitutional:      Appearance: He is well-developed.  Eyes:     Pupils: Pupils are equal, round, and reactive to light.  Pulmonary:     Effort: Pulmonary effort is normal.  Skin:    General: Skin is warm and dry.  Neurological:     Mental Status: He is alert and oriented to person, place, and time.  Psychiatric:        Behavior: Behavior normal.     Ortho Exam awake alert and oriented x3.  Comfortable sitting straight leg raise was positive on the left for right buttock pain at about 90  degrees and positive about 80 degrees on the right for right buttock pain.  Motor exam intact.  No loss of sensation.  Painless range of motion of both knees and both hips.  Some percussible tenderness of the lumbar spine predominately in the right paralumbar region.  Specialty Comments:  No specialty comments available.  Imaging: No results found.   PMFS History: Patient Active Problem List   Diagnosis Date Noted  . Low back pain 08/19/2019  . Transaminitis 02/26/2017  . Tobacco abuse disorder 02/26/2017  . Acute pancreatitis 02/20/2017   Past Medical History:  Diagnosis Date  . History of kidney stones   . Hypertension   . Pancreatitis     Family History  Problem Relation Age of Onset  . Hypertension Mother     Past Surgical History:  Procedure Laterality Date  . CHOLECYSTECTOMY    . ESOPHAGOGASTRODUODENOSCOPY    . LITHOTRIPSY    . NERVE SURGERY     Left elbow   Social History   Occupational History  . Not on file  Tobacco Use  . Smoking status: Current Every Day Smoker    Packs/day: 0.50  . Smokeless tobacco: Never Used  Substance and Sexual Activity  . Alcohol use: Yes    Comment: occasional  . Drug use: No  . Sexual activity: Not on file

## 2019-08-19 NOTE — Addendum Note (Signed)
Addended by: Wendi Maya on: 08/19/2019 02:28 PM   Modules accepted: Orders

## 2019-08-20 MED ORDER — VITAMIN E 402 MG (600 UNIT) CAPSULE
ORAL_CAPSULE | Freq: Every day | ORAL | 0 refills | 60.00000 days | Status: CP
Start: 2019-08-20 — End: 2019-10-19

## 2019-08-20 MED ORDER — VITAMIN A CAPSULE 3,000 MCG RAE (10000 UNITS)
ORAL_CAPSULE | ORAL | 0 refills | 74.00000 days | Status: CP
Start: 2019-08-20 — End: 2019-11-02

## 2019-08-25 ENCOUNTER — Telehealth: Payer: Self-pay | Admitting: Orthopaedic Surgery

## 2019-08-25 NOTE — Telephone Encounter (Signed)
Patient called.   He need a note faxed to his job. It needs to inform them that he will be out until his MRI is completed.   Fax number: [phone]

## 2019-08-25 NOTE — Telephone Encounter (Signed)
Called and spoke with patient. Faxed note to employer as ask by patient.

## 2019-08-26 ENCOUNTER — Telehealth: Payer: Self-pay | Admitting: Orthopaedic Surgery

## 2019-08-26 NOTE — Telephone Encounter (Signed)
Received vm requesting order for MRI (counln't understand name of person/strong accent) faxed to [facility] [phone], ph [phone]

## 2019-08-27 NOTE — Telephone Encounter (Signed)
refaxed order with stamped signature

## 2019-09-16 ENCOUNTER — Encounter: Payer: Self-pay | Admitting: Orthopaedic Surgery

## 2019-09-16 ENCOUNTER — Ambulatory Visit (INDEPENDENT_AMBULATORY_CARE_PROVIDER_SITE_OTHER): Payer: Worker's Compensation | Admitting: Orthopaedic Surgery

## 2019-09-16 ENCOUNTER — Other Ambulatory Visit: Payer: Self-pay

## 2019-09-16 VITALS — Ht 73.0 in | Wt 190.0 lb

## 2019-09-16 DIAGNOSIS — M5441 Lumbago with sciatica, right side: Secondary | ICD-10-CM | POA: Diagnosis not present

## 2019-09-16 NOTE — Progress Notes (Signed)
Office Visit Note   Patient: Darren Adams           Date of Birth: Apr 15, 1980           MRN: [id number] Visit Date: 09/16/2019              Requested by: Medicine, [facility] [address] [place],  [place] [address] PCP: Medicine, [facility] Internal   Assessment & Plan: Visit Diagnoses:  1. Acute right-sided low back pain with right-sided sciatica     Plan: Mr. Runco had an MRI scan of his lumbar spine performed on 08/31/2019.  At L4-5 there was evidence of a small right foraminal and extraforaminal disc protrusion leading the right L4 nerve root.  There was mild right neural foraminal stenosis but no significant spinal canal or left neural foraminal stenosis.  At L5-S1 there was mild disc desiccation and disc height loss.  There is small central disc extrusion mildly indenting the ventral sac and also abutted the bilateral descending S1 nerve roots.  He had mild to moderate spinal canal stenosis.  There was mild anterior wedging of the T12 vertebral body with mild marrow edema in the upper anterior portion of the T12 vertebral body raising the possibility of subacute compression.  There was no retropulsion.  Mr. Woolbright sustained an on-the-job injury as previously mentioned.  He is having mostly lower back pain with referred discomfort into his right lower extremity.  I suspect the majority of that is related to the L4-5 and L5-S1 level.  Will order an epidural steroid injection and keep him out of work and have him return shortly thereafter.  Depending upon his relief will consider referral for kyphoplasty of T12.  Follow-Up Instructions: Return After epidural steroid injection.   Orders:  Orders Placed This Encounter  Procedures  . Epidural Steroid Injection - Lumbar/Sacral (Ancillary Performed)   No orders of the defined types were placed in this encounter.     Procedures: No procedures performed   Clinical Data: No additional findings.   Subjective: Chief Complaint  Patient  presents with  . Lower Back - Follow-up    MRI review  Patient presents today for follow up on his lower back. He had an MRI of his L-Spine on 08/31/2019 at [facility]. 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 He states that his back feels better while sitting. He has an increase in pain with any movement or activity. He is taking Ibuprofen or tylenol. He wears a lumbar support, but states that he cannot tell if it really helps with the pain. He is currently out of work.  Pain continues to be in his lumbar spine and into the right lower extremity he continues to wear lumbar support.  Activity on his feet or any movement causes his pain.  HPI  Review of Systems   Objective: Vital Signs: Ht 6\' 1"  (1.854 m)   Wt 190 lb (86.2 kg)   BMI 25.07 kg/m   Physical Exam Constitutional:      Appearance: He is well-developed.  Eyes:     Pupils: Pupils are equal, round, and reactive to light.  Pulmonary:     Effort: Pulmonary effort is normal.  Skin:    General: Skin is warm and dry.  Neurological:     Mental Status: He is alert and oriented to person, place, and time.  Psychiatric:        Behavior: Behavior normal.     Ortho Exam straight leg raise negative bilaterally.  Motor and  sensory exam appears to be intact.  Some percussible tenderness lumbar spine more distally than proximally.  No pain over either flank or SI joint.  Painless range of motion both hips  Specialty Comments:  No specialty comments available.  Imaging: No results found.   PMFS History: Patient Active Problem List   Diagnosis Date Noted  . Low back pain 08/19/2019  . Transaminitis 02/26/2017  . Tobacco abuse disorder 02/26/2017  . Acute pancreatitis 02/20/2017   Past Medical History:  Diagnosis Date  . History of kidney stones   . Hypertension   . Pancreatitis     Family History  Problem Relation Age of Onset  . Hypertension Mother     Past Surgical History:  Procedure Laterality Date  . CHOLECYSTECTOMY    .  ESOPHAGOGASTRODUODENOSCOPY    . LITHOTRIPSY    . NERVE SURGERY     Left elbow   Social History   Occupational History  . Not on file  Tobacco Use  . Smoking status: Current Every Day Smoker    Packs/day: 0.50  . Smokeless tobacco: Never Used  Substance and Sexual Activity  . Alcohol use: Yes    Comment: occasional  . Drug use: No  . Sexual activity: Not on file

## 2019-09-23 ENCOUNTER — Encounter
Admit: 2019-09-23 | Discharge: 2019-09-24 | Discharge status: Against Medical Advice | Payer: PRIVATE HEALTH INSURANCE | Attending: Physician Assistant

## 2019-09-23 ENCOUNTER — Emergency Department
Admit: 2019-09-23 | Discharge: 2019-09-24 | Discharge status: Against Medical Advice | Payer: PRIVATE HEALTH INSURANCE | Attending: Physician Assistant

## 2019-09-23 DIAGNOSIS — Z9081 Acquired absence of spleen: Principal | ICD-10-CM

## 2019-09-23 DIAGNOSIS — U071 COVID-19: Principal | ICD-10-CM

## 2019-09-23 DIAGNOSIS — Z9489 Other transplanted organ and tissue status: Principal | ICD-10-CM

## 2019-09-23 MED ORDER — ALBUTEROL SULFATE HFA 90 MCG/ACTUATION AEROSOL INHALER
RESPIRATORY_TRACT | 0 refills | 0.00000 days | Status: CP | PRN
Start: 2019-09-23 — End: 2020-09-22

## 2019-10-16 ENCOUNTER — Telehealth: Payer: Self-pay | Admitting: Physical Medicine and Rehabilitation

## 2019-10-16 NOTE — Telephone Encounter (Signed)
-----   Message from Bridgett Larsson sent at 10/13/2019  8:17 AM EDT ----- Regarding: SECURE  phone number for Darren Adams  I reached out with [employer] Adams person Darren Adams which gave me the below info., with phone numbers.  Thx Darren Adams  [email or web address]> Mon 10/12/2019 1:10 PM #Caution - External email - see footer for warnings#  Hello  Darren Adams,  He has two numbers [phone] and [phone]. Which you may reach him at the [phone].   Also, his email address is [email or web address].   KindRegards,  Darren Adams / Workers' Compensation Claims T [phone] F [phone] E [email or web address] A [address] [address], [place], [place]  [address]

## 2019-10-16 NOTE — Telephone Encounter (Signed)
Called patient again and left message. Approved for right L4-5 IL.

## 2019-10-20 ENCOUNTER — Telehealth: Payer: Self-pay | Admitting: Physical Medicine and Rehabilitation

## 2019-10-20 NOTE — Telephone Encounter (Signed)
Patient returned call asked for a call back. The number to contact patient is [phone]

## 2019-10-20 NOTE — Telephone Encounter (Signed)
Left message #1

## 2019-10-20 NOTE — Telephone Encounter (Signed)
Scheduled for 11/2 at 1500 with driver and no blood thinners.

## 2019-11-05 ENCOUNTER — Ambulatory Visit: Admit: 2019-11-05 | Discharge: 2019-11-09 | Payer: PRIVATE HEALTH INSURANCE

## 2019-11-05 ENCOUNTER — Encounter
Admit: 2019-11-05 | Discharge: 2019-11-09 | Payer: PRIVATE HEALTH INSURANCE | Attending: Registered Nurse | Primary: Registered Nurse

## 2019-11-10 ENCOUNTER — Ambulatory Visit: Payer: 59 | Admitting: Physical Medicine and Rehabilitation

## 2020-01-13 ENCOUNTER — Ambulatory Visit: Payer: 59 | Admitting: Orthopaedic Surgery

## 2020-02-03 ENCOUNTER — Ambulatory Visit (INDEPENDENT_AMBULATORY_CARE_PROVIDER_SITE_OTHER): Payer: Worker's Compensation | Admitting: Orthopaedic Surgery

## 2020-02-03 ENCOUNTER — Encounter: Payer: Self-pay | Admitting: Orthopaedic Surgery

## 2020-02-03 ENCOUNTER — Other Ambulatory Visit: Payer: Self-pay

## 2020-02-03 DIAGNOSIS — M5441 Lumbago with sciatica, right side: Secondary | ICD-10-CM

## 2020-02-03 NOTE — Progress Notes (Signed)
Office Visit Note   Patient: Darren Adams           Date of Birth: 12-21-1980           MRN: [id number] Visit Date: 02/03/2020              Requested by: Medicine, [facility] Internal [address] [place],  [place] [address] PCP: Medicine, [facility] Internal   Assessment & Plan: Visit Diagnoses:  1. Acute right-sided low back pain with right-sided sciatica     Plan: I have not seen Mr. Bellanca since September.  He sustained an on-the-job injury to his lumbar spine.  He had an MRI scan demonstrating some degenerative changes and a small protruding disc height suggested an evaluation by Dr. Alvester Morin for an epidural steroid injection but he elected not to proceed.  He notes that he is really not having much trouble with his legs.  Only occasionally some right thigh pain.  He still having discomfort with his back.  He notes that it is "achy and sore".  He is not had any numbness or tingling or weakness of either lower extremity.  Has not worked.  Still having some Workmen's Comp. issues.  After much discussion  he'd like to proceed with some physical therapy.  We'll request this from [employer]. and plan to see him back in a month.  He relates that he is not capable of work at this point.  Follow-Up Instructions: Return in about 1 month (around 03/05/2020).   Orders:  Orders Placed This Encounter  Procedures  . Ambulatory referral to Physical Therapy   No orders of the defined types were placed in this encounter.     Procedures: No procedures performed   Clinical Data: No additional findings.   Subjective: Chief Complaint  Patient presents with  . Lower Back - Pain    OTJI 07/17/2019  Patient returns with continued low back pain after on the job injury 07/17/2019. He states that he is not much better. He did not have the lumbar epidural steroid injection.  Still having issues with Workmen's Comp.  He has not worked in being covered by [employer].Marland Kitchen  He is having less pain in his right thigh.   Denies any bowel or bladder changes.  He is not had any numbness or tingling or weakness in either lower extremity.  His biggest issue now is in his back and not necessarily in either lower extremity. He did have an MRI scan in August demonstrating a small right foraminal and extraforaminal disc at L4-5 abutting the right L4 nerve root.  There was mild right neural foraminal stenosis but no significant spinal canal or left neural foraminal stenosis.  At L5-S1 there was mild disc desiccation and disc height loss.  He had mild to moderate spinal canal stenosis at L5-S1.  There is also a possibility of a subacute compression along the anterior portion of T12.  No retropulsion. HPI  Review of Systems   Objective: Vital Signs: There were no vitals taken for this visit.  Physical Exam Constitutional:      Appearance: He is well-developed and well-nourished.  HENT:     Mouth/Throat:     Mouth: Oropharynx is clear and moist.  Eyes:     Extraocular Movements: EOM normal.     Pupils: Pupils are equal, round, and reactive to light.  Pulmonary:     Effort: Pulmonary effort is normal.  Skin:    General: Skin is warm and dry.  Neurological:     Mental Status: He is alert and oriented to person, place, and time.  Psychiatric:        Mood and Affect: Mood and affect normal.        Behavior: Behavior normal.     Ortho Exam awake alert and oriented x3.  Comfortable sitting.  Straight leg raise negative bilaterally.  Reflexes are decreased but symmetrical.  Good strength in both lower extremities.  No percussible tenderness lumbar spine or over either SI joint. Specialty Comments:  No specialty comments available.  Imaging: No results found.   PMFS History: Patient Active Problem List   Diagnosis Date Noted  . Low back pain 08/19/2019  . Transaminitis 02/26/2017  . Tobacco abuse disorder 02/26/2017  . Acute pancreatitis 02/20/2017   Past Medical History:  Diagnosis Date  . History of  kidney stones   . Hypertension   . Pancreatitis     Family History  Problem Relation Age of Onset  . Hypertension Mother     Past Surgical History:  Procedure Laterality Date  . CHOLECYSTECTOMY    . ESOPHAGOGASTRODUODENOSCOPY    . LITHOTRIPSY    . NERVE SURGERY     Left elbow   Social History   Occupational History  . Not on file  Tobacco Use  . Smoking status: Current Every Day Smoker    Packs/day: 0.50  . Smokeless tobacco: Never Used  Substance and Sexual Activity  . Alcohol use: Yes    Comment: occasional  . Drug use: No  . Sexual activity: Not on file

## 2020-02-08 ENCOUNTER — Telehealth: Payer: Self-pay | Admitting: Orthopaedic Surgery

## 2020-02-08 NOTE — Telephone Encounter (Signed)
Casimiro Needle case manager [facility] Case Management [phone] she has questions reguarding this pt. Please cakk ger

## 2020-02-08 NOTE — Telephone Encounter (Signed)
Hey, Do you want Dr.Whitfield to call case manager?

## 2020-03-08 ENCOUNTER — Ambulatory Visit: Payer: 59 | Admitting: Orthopaedic Surgery

## 2020-03-16 ENCOUNTER — Encounter: Payer: Self-pay | Admitting: Orthopaedic Surgery

## 2020-03-16 ENCOUNTER — Other Ambulatory Visit: Payer: Self-pay

## 2020-03-16 ENCOUNTER — Ambulatory Visit (INDEPENDENT_AMBULATORY_CARE_PROVIDER_SITE_OTHER): Payer: Worker's Compensation | Admitting: Orthopaedic Surgery

## 2020-03-16 VITALS — Ht 73.0 in | Wt 190.0 lb

## 2020-03-16 DIAGNOSIS — M5441 Lumbago with sciatica, right side: Secondary | ICD-10-CM | POA: Diagnosis not present

## 2020-03-16 MED ORDER — METHOCARBAMOL 500 MG PO TABS: ORAL_TABLET | ORAL | 0 refills | Status: AC

## 2020-03-16 NOTE — Progress Notes (Signed)
Office Visit Note   Patient: Darren Adams           Date of Birth: 17-Dec-1980           MRN: [id number] Visit Date: 03/16/2020              Requested by: Medicine, [facility] Internal [address] [place],  [place] [address] PCP: Medicine, [facility] Internal   Assessment & Plan: Visit Diagnoses:  1. Acute right-sided low back pain with right-sided sciatica     Plan: Mr. Deneault stating that on-the-job injury at the [employer] where he works in July 2021.  I have evaluated him on occasion with his prior MRI scan demonstrating some areas of degenerative change and even protruding disks.  There was a questionable fracture of T12 vertebral body.  He has not worked since July and notes that he is feeling better with only occasional tingling in his right thigh.  He does have back pain and is taking Advil.  He does have a back support.  I had suggested an evaluation by Dr. Alvester Morin but he did not want to pursue that.  Of also ordered physical therapy but he has yet to have any therapy.  Long discussion today regarding his present status.  He like to return to work on Monday, March 14 working half days for 2weeks.  I think that would be fine.  I think at this point he is pretty stable still would be nice for him to go to physical therapy.  Not sure what is going on with the insurance company neurologically intact  Follow-Up Instructions: Return in about 1 month (around 04/16/2020).   Orders:  No orders of the defined types were placed in this encounter.  Meds ordered this encounter  Medications  . methocarbamol (ROBAXIN) 500 MG tablet    Sig: Take 1 tablet by mouth twice daily.    Dispense:  30 tablet    Refill:  0      Procedures: No procedures performed   Clinical Data: No additional findings.   Subjective: Chief Complaint  Patient presents with  . Lower Back - Follow-up  Patient presents today for follow up on his lower back. He states that he still experiences pain, worse with activity. He has  some pain and tingling in his right leg. He takes Ibuprofen as needed. 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   HPI  Review of Systems   Objective: Vital Signs: Ht 6\' 1"  (1.854 m)   Wt 190 lb (86.2 kg)   BMI 25.07 kg/m   Physical Exam Constitutional:      Appearance: He is well-developed and well-nourished.  HENT:     Mouth/Throat:     Mouth: Oropharynx is clear and moist.  Eyes:     Extraocular Movements: EOM normal.     Pupils: Pupils are equal, round, and reactive to light.  Pulmonary:     Effort: Pulmonary effort is normal.  Skin:    General: Skin is warm and dry.  Neurological:     Mental Status: He is alert and oriented to person, place, and time.  Psychiatric:        Mood and Affect: Mood and affect normal.        Behavior: Behavior normal.     Ortho Exam comfortable sitting.  Walks without a limp.  Straight leg raise negative bilaterally.  Reflexes are symmetrical.  Motor and sensory exam intact to both lower extremities.  No significant percussible tenderness of either  right or left side of the lumbosacral spine or sacrum Specialty Comments:  No specialty comments available.  Imaging: No results found.   PMFS History: Patient Active Problem List   Diagnosis Date Noted  . Low back pain 08/19/2019  . Transaminitis 02/26/2017  . Tobacco abuse disorder 02/26/2017  . Acute pancreatitis 02/20/2017   Past Medical History:  Diagnosis Date  . History of kidney stones   . Hypertension   . Pancreatitis     Family History  Problem Relation Age of Onset  . Hypertension Mother     Past Surgical History:  Procedure Laterality Date  . CHOLECYSTECTOMY    . ESOPHAGOGASTRODUODENOSCOPY    . LITHOTRIPSY    . NERVE SURGERY     Left elbow   Social History   Occupational History  . Not on file  Tobacco Use  . Smoking status: Current Every Day Smoker    Packs/day: 0.50  . Smokeless tobacco: Never Used  Substance and Sexual Activity  . Alcohol use: Yes    Comment:  occasional  . Drug use: No  . Sexual activity: Not on file

## 2020-03-22 ENCOUNTER — Ambulatory Visit: Payer: 59 | Admitting: Orthopaedic Surgery

## 2020-03-31 DIAGNOSIS — Z9483 Pancreas transplant status: Principal | ICD-10-CM

## 2020-03-31 DIAGNOSIS — Z90410 Acquired total absence of pancreas: Principal | ICD-10-CM

## 2020-04-27 ENCOUNTER — Other Ambulatory Visit: Payer: Self-pay

## 2020-04-27 ENCOUNTER — Ambulatory Visit: Payer: 59 | Admitting: Orthopaedic Surgery

## 2020-05-25 ENCOUNTER — Ambulatory Visit: Admit: 2020-05-25 | Discharge: 2020-05-26

## 2020-05-25 ENCOUNTER — Ambulatory Visit
Admit: 2020-05-25 | Discharge: 2020-05-26 | Attending: Preventive Medicine/Occupational Environmental Medicine | Primary: Preventive Medicine/Occupational Environmental Medicine

## 2020-05-25 DIAGNOSIS — Z021 Encounter for pre-employment examination: Principal | ICD-10-CM

## 2020-08-14 ENCOUNTER — Emergency Department
Admit: 2020-08-14 | Discharge: 2020-08-14 | Discharge status: Against Medical Advice | Payer: PRIVATE HEALTH INSURANCE | Attending: Physician Assistant

## 2020-08-14 ENCOUNTER — Ambulatory Visit
Admit: 2020-08-14 | Discharge: 2020-08-14 | Discharge status: Against Medical Advice | Payer: PRIVATE HEALTH INSURANCE | Attending: Physician Assistant

## 2020-08-14 DIAGNOSIS — R109 Unspecified abdominal pain: Principal | ICD-10-CM

## 2020-08-15 DIAGNOSIS — Z9483 Pancreas transplant status: Principal | ICD-10-CM

## 2020-09-05 ENCOUNTER — Ambulatory Visit
Admit: 2020-09-05 | Discharge: 2020-09-06 | Payer: PRIVATE HEALTH INSURANCE | Attending: Nutritionist | Primary: Nutritionist

## 2020-09-05 DIAGNOSIS — K2900 Acute gastritis without bleeding: Principal | ICD-10-CM

## 2020-09-05 DIAGNOSIS — Z9483 Pancreas transplant status: Principal | ICD-10-CM

## 2020-09-05 DIAGNOSIS — Z90410 Acquired total absence of pancreas: Principal | ICD-10-CM

## 2021-02-28 DIAGNOSIS — Z9081 Acquired absence of spleen: Principal | ICD-10-CM

## 2021-02-28 DIAGNOSIS — K860 Alcohol-induced chronic pancreatitis: Principal | ICD-10-CM

## 2021-02-28 DIAGNOSIS — K861 Other chronic pancreatitis: Principal | ICD-10-CM

## 2021-02-28 MED ORDER — LIPASE-PROTEASE-AMYLASE 36,000-114,000-180,000 UNIT CAPSULE,DELAY REL
ORAL_CAPSULE | 11 refills | 0 days
Start: 2021-02-28 — End: 2022-02-27

## 2021-03-01 MED ORDER — LIPASE-PROTEASE-AMYLASE 36,000-114,000-180,000 UNIT CAPSULE,DELAY REL
ORAL_CAPSULE | 11 refills | 0 days
Start: 2021-03-01 — End: 2022-02-27

## 2021-03-02 DIAGNOSIS — Z9081 Acquired absence of spleen: Principal | ICD-10-CM

## 2021-03-02 DIAGNOSIS — K860 Alcohol-induced chronic pancreatitis: Principal | ICD-10-CM

## 2021-03-02 DIAGNOSIS — K861 Other chronic pancreatitis: Principal | ICD-10-CM

## 2021-03-02 MED ORDER — LIPASE-PROTEASE-AMYLASE 36,000-114,000-180,000 UNIT CAPSULE,DELAY REL
ORAL_CAPSULE | 0 refills | 0 days | Status: CP
Start: 2021-03-02 — End: 2022-03-01

## 2021-03-03 DIAGNOSIS — K860 Alcohol-induced chronic pancreatitis: Principal | ICD-10-CM

## 2021-03-03 DIAGNOSIS — K861 Other chronic pancreatitis: Principal | ICD-10-CM

## 2021-03-03 DIAGNOSIS — Z9081 Acquired absence of spleen: Principal | ICD-10-CM

## 2021-03-03 MED ORDER — LIPASE-PROTEASE-AMYLASE 36,000-114,000-180,000 UNIT CAPSULE,DELAY REL
ORAL_CAPSULE | 0 refills | 0 days | Status: CP
Start: 2021-03-03 — End: 2022-03-02

## 2021-03-07 DIAGNOSIS — Z90410 Acquired total absence of pancreas: Principal | ICD-10-CM

## 2021-03-07 DIAGNOSIS — Z9483 Pancreas transplant status: Principal | ICD-10-CM

## 2021-03-07 MED ORDER — LIPASE-PROTEASE-AMYLASE 36,000-114,000-180,000 UNIT CAPSULE,DELAY REL
ORAL_CAPSULE | ORAL | 0 refills | 0.00000 days | Status: CP
Start: 2021-03-07 — End: 2021-03-07

## 2021-03-08 DIAGNOSIS — Z9483 Pancreas transplant status: Principal | ICD-10-CM

## 2021-03-08 DIAGNOSIS — Z90410 Acquired total absence of pancreas: Principal | ICD-10-CM

## 2021-03-08 MED ORDER — PANCREAZE 37,000-97,300-149,900 UNIT CAPSULE,DELAYED RELEASE
ORAL_CAPSULE | 11 refills | 0 days | Status: CP
Start: 2021-03-08 — End: ?

## 2021-12-04 DIAGNOSIS — Z9081 Acquired absence of spleen: Principal | ICD-10-CM

## 2021-12-04 DIAGNOSIS — Z9489 Other transplanted organ and tissue status: Principal | ICD-10-CM

## 2021-12-04 DIAGNOSIS — E139 Other specified diabetes mellitus without complications: Principal | ICD-10-CM

## 2021-12-08 ENCOUNTER — Ambulatory Visit: Admit: 2021-12-08 | Discharge: 2021-12-09

## 2021-12-08 DIAGNOSIS — Z9489 Other transplanted organ and tissue status: Principal | ICD-10-CM

## 2021-12-08 DIAGNOSIS — E139 Other specified diabetes mellitus without complications: Principal | ICD-10-CM

## 2021-12-08 DIAGNOSIS — Z9081 Acquired absence of spleen: Principal | ICD-10-CM

## 2021-12-13 ENCOUNTER — Ambulatory Visit: Admit: 2021-12-13 | Discharge: 2021-12-14 | Attending: Family | Primary: Family

## 2021-12-13 DIAGNOSIS — Z9081 Acquired absence of spleen: Principal | ICD-10-CM

## 2021-12-13 DIAGNOSIS — E139 Other specified diabetes mellitus without complications: Principal | ICD-10-CM

## 2021-12-13 DIAGNOSIS — R141 Gas pain: Principal | ICD-10-CM

## 2021-12-13 MED ORDER — PEN NEEDLE, DIABETIC 32 GAUGE X 5/32" (4 MM)
3 refills | 0 days | Status: CP
Start: 2021-12-13 — End: ?

## 2021-12-20 ENCOUNTER — Encounter: Admit: 2021-12-20 | Discharge: 2021-12-20 | Attending: Anesthesiology | Primary: Anesthesiology

## 2021-12-20 ENCOUNTER — Ambulatory Visit: Admit: 2021-12-20 | Discharge: 2021-12-20
